# Patient Record
Sex: Female | Born: 2000 | Race: White | Hispanic: No | Marital: Single | Smoking: Former smoker
Health system: Southern US, Community
[De-identification: ages and names within clinical notes are randomized; demographics above are authoritative.]

## PROBLEM LIST (undated history)

## (undated) DIAGNOSIS — R110 Nausea: Secondary | ICD-10-CM

## (undated) DIAGNOSIS — F419 Anxiety disorder, unspecified: Secondary | ICD-10-CM

## (undated) DIAGNOSIS — F429 Obsessive-compulsive disorder, unspecified: Secondary | ICD-10-CM

## (undated) DIAGNOSIS — R109 Unspecified abdominal pain: Secondary | ICD-10-CM

## (undated) DIAGNOSIS — U071 COVID-19: Secondary | ICD-10-CM

## (undated) HISTORY — PX: TONSILLECTOMY: SUR1361

## (undated) HISTORY — DX: Unspecified abdominal pain: R10.9

## (undated) HISTORY — DX: Obsessive-compulsive disorder, unspecified: F42.9

## (undated) HISTORY — DX: Anxiety disorder, unspecified: F41.9

## (undated) HISTORY — DX: Nausea: R11.0

## (undated) HISTORY — PX: KNEE SURGERY: SHX244

## (undated) HISTORY — PX: WISDOM TOOTH EXTRACTION: SHX21

---
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
## 2020-04-09 ENCOUNTER — Ambulatory Visit: Payer: BC Managed Care – PPO

## 2020-04-26 ENCOUNTER — Other Ambulatory Visit: Payer: Self-pay | Admitting: Internal Medicine

## 2020-04-26 DIAGNOSIS — F419 Anxiety disorder, unspecified: Secondary | ICD-10-CM

## 2020-05-12 ENCOUNTER — Other Ambulatory Visit: Payer: Self-pay

## 2020-05-12 ENCOUNTER — Emergency Department
Admission: EM | Admit: 2020-05-12 | Discharge: 2020-05-12 | Disposition: A | Discharge status: Home / Self Care | Payer: 59 | Attending: Emergency Medicine | Admitting: Emergency Medicine

## 2020-05-12 ENCOUNTER — Encounter: Payer: Self-pay | Admitting: Medical Oncology

## 2020-05-12 ENCOUNTER — Emergency Department: Payer: 59

## 2020-05-12 DIAGNOSIS — Y9301 Activity, walking, marching and hiking: Secondary | ICD-10-CM | POA: Diagnosis not present

## 2020-05-12 DIAGNOSIS — Z91010 Allergy to peanuts: Secondary | ICD-10-CM | POA: Diagnosis not present

## 2020-05-12 DIAGNOSIS — S83004A Unspecified dislocation of right patella, initial encounter: Secondary | ICD-10-CM | POA: Diagnosis not present

## 2020-05-12 DIAGNOSIS — Z87891 Personal history of nicotine dependence: Secondary | ICD-10-CM | POA: Diagnosis not present

## 2020-05-12 DIAGNOSIS — S8991XA Unspecified injury of right lower leg, initial encounter: Secondary | ICD-10-CM | POA: Diagnosis present

## 2020-05-12 DIAGNOSIS — X501XXA Overexertion from prolonged static or awkward postures, initial encounter: Secondary | ICD-10-CM | POA: Diagnosis not present

## 2020-05-12 MED ORDER — KETOROLAC TROMETHAMINE 30 MG/ML IJ SOLN
15.0000 mg | Freq: Once | INTRAMUSCULAR | Status: AC
  Administered 2020-05-12: 15 mg via INTRAVENOUS
  Filled 2020-05-12: qty 1

## 2020-05-12 NOTE — ED Triage Notes (Signed)
Pt reports that she was walking to her bed when her knee cap dislocated. Pt reports that she has had this happen before about 2 years ago, Pt was given of fentanyl pta.

## 2020-05-12 NOTE — ED Notes (Signed)
PT verbally signed discharge paper work

## 2020-05-12 NOTE — ED Provider Notes (Signed)
.  She denies  [facility] Emergency Department Provider Note ____________________________________________   Event Date/Time   First MD Initiated Contact with Patient 05/12/20 1412     (approximate)  I have reviewed the triage vital signs and the nursing notes.  HISTORY  Chief Complaint Knee Pain   HPI Stacey Foley is a 20 y.o. femalewho presents to the ED for evaluation of knee pain.   Chart review indicates no relevant medical history. Patient self-reports a history of patellar dislocation about 4 years ago.  Patient reports no recent illnesses.  She was ambulating this morning when she did a twisting motion and had immediate pain and dislocation to her right patella any falls, trauma, syncope, head injury or pain beyond her right knee.  Presents to the ED via EMS and 10/10 right knee pain.  I empirically reduce patellar dislocation on arrival.  Resolution of pain thereafter.  Past Medical History:  Diagnosis Date  . Abdominal pain   . Anxiety   . Anxiety   . Nausea     Patient Active Problem List   Diagnosis Date Noted  . Irritable bowel syndrome with both constipation and diarrhea 09/11/2019  . Anxiety and depression 09/11/2019  . Insomnia 05/03/2019  . Generalized anxiety disorder 12/03/2017  . Abdominal pain   . Nausea     Past Surgical History:  Procedure Laterality Date  . TONSILLECTOMY    . WISDOM TOOTH EXTRACTION      Prior to Admission medications   Medication Sig Start Date End Date Taking? Authorizing Provider  citalopram (CELEXA) 10 MG tablet TAKE 1 TABLET BY MOUTH EVERY DAY 04/28/20   McLean-Scocuzza, Pasty Spillers, MD  Clotrimazole 1 % OINT Apply 1 application topically 2 (two) times daily as needed. 10/05/19   McLean-Scocuzza, Pasty Spillers, MD  drospirenone-ethinyl estradiol (YASMIN) 3-0.03 MG tablet See admin instructions. 09/02/18   [provider]  hydrOXYzine (ATARAX/VISTARIL) 25 MG tablet TAKE 0.5-1 TABLETS (12.5-25 MG  TOTAL) BY MOUTH DAILY AS NEEDED FOR ANXIETY. 05/25/19   McLean-Scocuzza, Pasty Spillers, MD  mupirocin ointment (BACTROBAN) 2 % Apply 1 application topically 3 (three) times daily as needed. 10/05/19   McLean-Scocuzza, Pasty Spillers, MD    Allergies Peanut-containing drug products  Family History  Problem Relation Age of Onset  . Hypertension Father     Social History Social History   Tobacco Use  . Smoking status: Former Smoker    Types: E-cigarettes  . Smokeless tobacco: Never Used  . Tobacco comment: cigs then E cigs   Vaping Use  . Vaping Use: Every day  Substance Use Topics  . Alcohol use: Never  . Drug use: Never    Review of Systems  Constitutional: No fever/chills Eyes: No visual changes. ENT: No sore throat. Cardiovascular: Denies chest pain. Respiratory: Denies shortness of breath. Gastrointestinal: No abdominal pain.  No nausea, no vomiting.  No diarrhea.  No constipation. Genitourinary: Negative for dysuria. Musculoskeletal: Negative for back pain.  Positive for right knee pain after twisting injury. Skin: Negative for rash. Neurological: Negative for headaches, focal weakness or numbness.  ____________________________________________   PHYSICAL EXAM:  VITAL SIGNS: Vitals:   05/12/20 1420  BP: (!) 108/57  Pulse: (!) 104  Resp: 18  Temp: 98.7 F (37.1 C)  SpO2: 100%     Constitutional: Alert and oriented. Well appearing and in no acute distress. Eyes: Conjunctivae are normal. PERRL. EOMI. Head: Atraumatic. Nose: No congestion/rhinnorhea. Mouth/Throat: Mucous membranes are moist.  Oropharynx non-erythematous. Neck: No  stridor. No cervical spine tenderness to palpation. Cardiovascular: Normal rate, regular rhythm. Grossly normal heart sounds.  Good peripheral circulation. Respiratory: Normal respiratory effort.  No retractions. Lungs CTAB. Gastrointestinal: Soft , nondistended, nontender to palpation. No CVA tenderness. Musculoskeletal: On presentation prior  to empiric reduction, evidence of closed right-sided patellar dislocation.  Patella is laterally dislocated without overlying laceration or signs of open injury. Right leg is distally neurovascularly intact. Palpation of all 4 extremities otherwise no evidence of deformity trauma or tenderness. Neurologic:  Normal speech and language. No gross focal neurologic deficits are appreciated. No gait instability noted. Skin:  Skin is warm, dry and intact. No rash noted. Psychiatric: Mood and affect are normal. Speech and behavior are normal.  ____________________________________________   LABS (all labs ordered are listed, but only abnormal results are displayed)  Labs Reviewed - No data to display  ____________________________________________  RADIOLOGY  ED MD interpretation: Plain film of the right knee reviewed by me without evidence of fracture or dislocation.  Official radiology report(s): DG Knee Right Port  Result Date: 05/12/2020 CLINICAL DATA:  Reduction of lateral patellar dislocation EXAM: PORTABLE RIGHT KNEE - 1-2 VIEW COMPARISON:  07/23/2017 FINDINGS: Frontal and lateral views of the right knee demonstrate no fracture, subluxation, or dislocation. Joint spaces are well preserved. No joint effusion. Soft tissues are unremarkable. IMPRESSION: 1. Unremarkable right knee. Electronically Signed   By: Sharlet Salina M.D.   On: 05/12/2020 15:01    ____________________________________________   PROCEDURES and INTERVENTIONS  Procedure(s) performed (including Critical Care):  Reduction of dislocation  Date/Time: 05/12/2020 3:37 PM Performed by: Delton Prairie, MD Authorized by: Delton Prairie, MD  Local anesthesia used: no  Anesthesia: Local anesthesia used: no  Sedation: Patient sedated: no  Patient tolerance: patient tolerated the procedure well with no immediate complications Comments: Concurrent extension of the right knee and medial pressure on the patella yields empiric  reduction and return to anatomic alignment.     Medications  ketorolac (TORADOL) 30 MG/ML injection 15 mg (15 mg Intravenous Given 05/12/20 1427)    ____________________________________________   MDM / ED COURSE   20 year old woman presents to the ED with accidental traumatic patellar dislocation, amenable to empiric reduction and outpatient management.  Hemodynamically stable.  Presents with obvious lateral right-sided patellar dislocation that I reduce empirically on arrival with resolution of her pain.  Follow-up x-rays without evidence of any bony injury or further dislocation.  She provides her own knee immobilizer, which will have the nurses assist her with placing.  We discussed following up with orthopedic surgery and return precautions for the ED.  Patient medically stable for outpatient management.  Clinical Course as of 05/12/20 1538  Mon May 12, 2020  1413 Empirically reduced by me. [DS]  1530 Reassessed.  Mother now the bedside.  Patient looks well and denies any pain.  Reports for much better.  They actually brought their own knee immobilizer from home.  They report that she had a similar patellar dislocation about 4 years ago.  We discussed the immobilization and follow-up with orthopedic surgery.  We discussed return precautions for the ED. [DS]    Clinical Course User Index [DS] Delton Prairie, MD    ____________________________________________   FINAL CLINICAL IMPRESSION(S) / ED DIAGNOSES  Final diagnoses:  Dislocation of right patella, initial encounter     ED Discharge Orders    None       Dylan Katrinka Blazing   Note:  This document was prepared using Dragon voice recognition software and  may include unintentional dictation errors.   Delton Prairie, MD 05/12/20 1539

## 2020-05-12 NOTE — Discharge Instructions (Addendum)
Please take Tylenol and ibuprofen/Advil for your pain.  It is safe to take them together, or to alternate them every few hours.  Take up to 1000mg  of Tylenol at a time, up to 4 times per day.  Do not take more than 4000 mg of Tylenol in 24 hours.  For ibuprofen, take 400-600 mg, 4-5 times per day.  Please keep your knee in the knee immobilizer until he can follow-up with the orthopedic surgeons.  Return to the ED with any recurrent dislocations.

## 2020-05-12 NOTE — ED Notes (Signed)
Patient transported to X-ray 

## 2020-05-19 ENCOUNTER — Other Ambulatory Visit: Payer: Self-pay | Admitting: Family Medicine

## 2020-05-19 ENCOUNTER — Other Ambulatory Visit: Payer: Self-pay | Admitting: Student

## 2020-05-19 DIAGNOSIS — S83014A Lateral dislocation of right patella, initial encounter: Secondary | ICD-10-CM

## 2020-05-19 DIAGNOSIS — M2201 Recurrent dislocation of patella, right knee: Secondary | ICD-10-CM

## 2020-05-30 ENCOUNTER — Other Ambulatory Visit: Payer: Self-pay

## 2020-05-30 ENCOUNTER — Ambulatory Visit
Admission: RE | Admit: 2020-05-30 | Discharge: 2020-05-30 | Disposition: A | Discharge status: Home / Self Care | Payer: 59 | Source: Ambulatory Visit | Attending: Student | Admitting: Student

## 2020-05-30 DIAGNOSIS — M2201 Recurrent dislocation of patella, right knee: Secondary | ICD-10-CM | POA: Diagnosis present

## 2020-05-30 DIAGNOSIS — S83014A Lateral dislocation of right patella, initial encounter: Secondary | ICD-10-CM

## 2020-06-05 ENCOUNTER — Encounter: Payer: Self-pay | Admitting: Internal Medicine

## 2020-06-20 DIAGNOSIS — M25561 Pain in right knee: Secondary | ICD-10-CM | POA: Insufficient documentation

## 2020-07-08 ENCOUNTER — Encounter: Payer: Self-pay | Admitting: Internal Medicine

## 2020-07-08 DIAGNOSIS — F419 Anxiety disorder, unspecified: Secondary | ICD-10-CM

## 2020-07-08 MED ORDER — HYDROXYZINE HCL 25 MG PO TABS: 12.5000 mg | ORAL_TABLET | Freq: Every day | ORAL | 0 refills | Status: DC | PRN

## 2020-07-29 ENCOUNTER — Encounter: Payer: Self-pay | Admitting: Internal Medicine

## 2020-07-30 NOTE — Telephone Encounter (Signed)
Please advise 

## 2020-10-04 ENCOUNTER — Other Ambulatory Visit: Payer: Self-pay | Admitting: Internal Medicine

## 2020-10-04 DIAGNOSIS — F419 Anxiety disorder, unspecified: Secondary | ICD-10-CM

## 2020-10-12 ENCOUNTER — Other Ambulatory Visit: Payer: Self-pay | Admitting: Internal Medicine

## 2020-10-12 DIAGNOSIS — F419 Anxiety disorder, unspecified: Secondary | ICD-10-CM

## 2020-10-12 MED ORDER — HYDROXYZINE HCL 25 MG PO TABS: 12.5000 mg | ORAL_TABLET | Freq: Every day | ORAL | 3 refills | Status: DC | PRN

## 2020-10-20 ENCOUNTER — Ambulatory Visit
Admission: EM | Admit: 2020-10-20 | Discharge: 2020-10-20 | Disposition: A | Discharge status: Home / Self Care | Payer: 59 | Attending: Emergency Medicine | Admitting: Emergency Medicine

## 2020-10-20 ENCOUNTER — Other Ambulatory Visit: Payer: Self-pay

## 2020-10-20 DIAGNOSIS — B349 Viral infection, unspecified: Secondary | ICD-10-CM

## 2020-10-20 NOTE — Discharge Instructions (Addendum)
Your COVID test is pending.  You should self quarantine until the test result is back.    Take Tylenol or ibuprofen as needed for fever or discomfort.  Rest and keep yourself hydrated.    Follow-up with your primary care provider if your symptoms are not improving.     

## 2020-10-20 NOTE — ED Provider Notes (Signed)
[name]    CSN: [id number] Arrival date & time: 10/20/20  2563      History   Chief Complaint Chief Complaint  Patient presents with   Nasal Congestion    HPI Stacey Foley is a 20 y.o. female.  Patient presents with 4-day history of nasal congestion.  She had a sore throat and diarrhea but the symptoms have resolved.  Treatment at home with Tylenol, ibuprofen, Mucinex.  She denies fever, rash, cough, shortness of breath, vomiting, or other symptoms.  Her medical history includes IBS, anxiety, insomnia.  The history is provided by the patient and medical records.   Past Medical History:  Diagnosis Date   Abdominal pain    Anxiety    Anxiety    Nausea     Patient Active Problem List   Diagnosis Date Noted   Irritable bowel syndrome with both constipation and diarrhea 09/11/2019   Anxiety and depression 09/11/2019   Insomnia 05/03/2019   Generalized anxiety disorder 12/03/2017   Abdominal pain    Nausea     Past Surgical History:  Procedure Laterality Date   TONSILLECTOMY     WISDOM TOOTH EXTRACTION      OB History   No obstetric history on file.      Home Medications    Prior to Admission medications   Medication Sig Start Date End Date Taking? Authorizing Provider  citalopram (CELEXA) 10 MG tablet TAKE 1 TABLET BY MOUTH EVERY DAY 04/28/20   McLean-Scocuzza, Pasty Spillers, MD  Clotrimazole 1 % OINT Apply 1 application topically 2 (two) times daily as needed. 10/05/19   McLean-Scocuzza, Pasty Spillers, MD  drospirenone-ethinyl estradiol (YASMIN) 3-0.03 MG tablet See admin instructions. 09/02/18   [provider]  hydrOXYzine (ATARAX/VISTARIL) 25 MG tablet Take 0.5-1 tablets (12.5-25 mg total) by mouth daily as needed for anxiety. 10/12/20   McLean-Scocuzza, Pasty Spillers, MD  mupirocin ointment (BACTROBAN) 2 % Apply 1 application topically 3 (three) times daily as needed. 10/05/19   McLean-Scocuzza, Pasty Spillers, MD    Family History Family History  Problem  Relation Age of Onset   Hypertension Father     Social History Social History   Tobacco Use   Smoking status: Former    Types: E-cigarettes   Smokeless tobacco: Never   Tobacco comments:    cigs then E cigs   Vaping Use   Vaping Use: Every day  Substance Use Topics   Alcohol use: Never   Drug use: Never     Allergies   Peanut-containing drug products   Review of Systems Review of Systems  Constitutional:  Negative for chills and fever.  HENT:  Positive for congestion and sore throat. Negative for ear pain.   Respiratory:  Negative for cough and shortness of breath.   Cardiovascular:  Negative for chest pain and palpitations.  Gastrointestinal:  Positive for diarrhea. Negative for abdominal pain, nausea and vomiting.  Skin:  Negative for color change and rash.  All other systems reviewed and are negative.   Physical Exam Triage Vital Signs ED Triage Vitals  Enc Vitals Group     BP 10/20/20 1046 122/82     Pulse Rate 10/20/20 1046 96     Resp 10/20/20 1046 18     Temp 10/20/20 1046 99.1 F (37.3 C)     Temp Source 10/20/20 1046 Oral     SpO2 10/20/20 1046 97 %     Weight --      Height --  Head Circumference --      Peak Flow --      Pain Score 10/20/20 1049 0     Pain Loc --      Pain Edu? --      Excl. in GC? --    No data found.  Updated Vital Signs BP 122/82 (BP Location: Left Arm)   Pulse 96   Temp 99.1 F (37.3 C) (Oral)   Resp 18   LMP 09/29/2020 (Within Weeks)   SpO2 97%   Visual Acuity Right Eye Distance:   Left Eye Distance:   Bilateral Distance:    Right Eye Near:   Left Eye Near:    Bilateral Near:     Physical Exam Vitals and nursing note reviewed.  Constitutional:      General: She is not in acute distress.    Appearance: She is well-developed. She is not ill-appearing.  HENT:     Head: Normocephalic and atraumatic.     Right Ear: Tympanic membrane normal.     Left Ear: Tympanic membrane normal.     Nose: Nose  normal.     Mouth/Throat:     Mouth: Mucous membranes are moist.     Pharynx: Oropharynx is clear.  Eyes:     Conjunctiva/sclera: Conjunctivae normal.  Cardiovascular:     Rate and Rhythm: Normal rate and regular rhythm.     Heart sounds: Normal heart sounds.  Pulmonary:     Effort: Pulmonary effort is normal. No respiratory distress.     Breath sounds: Normal breath sounds.  Abdominal:     General: Bowel sounds are normal.     Palpations: Abdomen is soft.     Tenderness: There is no abdominal tenderness. There is no guarding or rebound.  Musculoskeletal:     Cervical back: Neck supple.  Skin:    General: Skin is warm and dry.  Neurological:     General: No focal deficit present.     Mental Status: She is alert and oriented to person, place, and time.     Gait: Gait normal.  Psychiatric:        Mood and Affect: Mood normal.        Behavior: Behavior normal.     UC Treatments / Results  Labs (all labs ordered are listed, but only abnormal results are displayed) Labs Reviewed  NOVEL CORONAVIRUS, NAA    EKG   Radiology No results found.  Procedures Procedures (including critical care time)  Medications Ordered in UC Medications - No data to display  Initial Impression / Assessment and Plan / UC Course  I have reviewed the triage vital signs and the nursing notes.  Pertinent labs & imaging results that were available during my care of the patient were reviewed by me and considered in my medical decision making (see chart for details).   Viral illness.  COVID pending.  Instructed patient to self quarantine per CDC guidelines.  Discussed symptomatic treatment including Tylenol or ibuprofen, rest, hydration.  Instructed patient to follow up with PCP if symptoms are not improving.  Patient agrees to plan of care.    Final Clinical Impressions(s) / UC Diagnoses   Final diagnoses:  Viral illness     Discharge Instructions      Your COVID test is pending.  You  should self quarantine until the test result is back.    Take Tylenol or ibuprofen as needed for fever or discomfort.  Rest and keep yourself hydrated.  Follow-up with your primary care provider if your symptoms are not improving.         ED Prescriptions   None    PDMP not reviewed this encounter.   Mickie Bail, NP 10/20/20 [phone]

## 2020-10-20 NOTE — ED Triage Notes (Signed)
Patient presents to Urgent Care with complaints of congestion x 4 days. Has a hx of seasonal allergies. Treating symptoms with tylenol, ibuprofen, and mucinex.   Denies fever.

## 2020-10-21 LAB — SARS-COV-2, NAA 2 DAY TAT

## 2020-10-21 LAB — NOVEL CORONAVIRUS, NAA: SARS-CoV-2, NAA: NOT DETECTED

## 2020-10-22 ENCOUNTER — Encounter: Payer: Self-pay | Admitting: Internal Medicine

## 2020-10-23 ENCOUNTER — Telehealth: Payer: Self-pay | Admitting: Internal Medicine

## 2020-10-23 ENCOUNTER — Encounter: Payer: Self-pay | Admitting: Internal Medicine

## 2020-10-23 ENCOUNTER — Other Ambulatory Visit: Payer: Self-pay

## 2020-10-23 ENCOUNTER — Emergency Department
Admission: EM | Admit: 2020-10-23 | Discharge: 2020-10-23 | Disposition: A | Discharge status: Home / Self Care | Payer: 59 | Attending: Emergency Medicine | Admitting: Emergency Medicine

## 2020-10-23 DIAGNOSIS — H538 Other visual disturbances: Secondary | ICD-10-CM

## 2020-10-23 DIAGNOSIS — Z91010 Allergy to peanuts: Secondary | ICD-10-CM | POA: Insufficient documentation

## 2020-10-23 DIAGNOSIS — H539 Unspecified visual disturbance: Secondary | ICD-10-CM

## 2020-10-23 DIAGNOSIS — Z87891 Personal history of nicotine dependence: Secondary | ICD-10-CM | POA: Diagnosis not present

## 2020-10-23 HISTORY — DX: COVID-19: U07.1

## 2020-10-23 NOTE — ED Triage Notes (Signed)
Pt comes into the ED via POV c/o blurred vision since being diagnosed with COVID on Tuesday.  Pt states she did clear the blurred vision some by using eye drops today.  Pt is neurologically intact at this time.  Pt states she is still seeing "floater" like objects.

## 2020-10-23 NOTE — ED Provider Notes (Signed)
[facility] EMERGENCY DEPARTMENT Provider Note   CSN: [id number] Arrival date & time: 10/23/20  1430     History Chief Complaint  Patient presents with   Covid Positive   Blurred Vision    Stacey Foley is a 20 y.o. female presents to the emergency department for evaluation of floaters in vision.  She was diagnosed with COVID 2 days ago, symptoms have been present for 3 days.  Her symptoms are feeling much better.  No chest pain, shortness of breath.  She has been taking some Mucinex.  She describes 2 days of blurred vision with seeing occasional floaters.  Vision is fine, she was able to drive over here.  She denies any visual field deficits nor headaches or fevers.      HPI     Past Medical History:  Diagnosis Date   Abdominal pain    Anxiety    Anxiety    COVID-19    10/22/20   Nausea     Patient Active Problem List   Diagnosis Date Noted   Irritable bowel syndrome with both constipation and diarrhea 09/11/2019   Anxiety and depression 09/11/2019   Insomnia 05/03/2019   Generalized anxiety disorder 12/03/2017   Abdominal pain    Nausea     Past Surgical History:  Procedure Laterality Date   TONSILLECTOMY     WISDOM TOOTH EXTRACTION       OB History   No obstetric history on file.     Family History  Problem Relation Age of Onset   Hypertension Father     Social History   Tobacco Use   Smoking status: Former    Types: E-cigarettes   Smokeless tobacco: Never   Tobacco comments:    cigs then E cigs   Vaping Use   Vaping Use: Every day  Substance Use Topics   Alcohol use: Never   Drug use: Never    Home Medications Prior to Admission medications   Medication Sig Start Date End Date Taking? Authorizing Provider  citalopram (CELEXA) 10 MG tablet TAKE 1 TABLET BY MOUTH EVERY DAY 04/28/20   McLean-Scocuzza, Pasty Spillers, MD  Clotrimazole 1 % OINT Apply 1 application topically 2 (two) times daily as needed. 10/05/19    McLean-Scocuzza, Pasty Spillers, MD  drospirenone-ethinyl estradiol (YASMIN) 3-0.03 MG tablet See admin instructions. 09/02/18   [provider]  hydrOXYzine (ATARAX/VISTARIL) 25 MG tablet Take 0.5-1 tablets (12.5-25 mg total) by mouth daily as needed for anxiety. 10/12/20   McLean-Scocuzza, Pasty Spillers, MD  mupirocin ointment (BACTROBAN) 2 % Apply 1 application topically 3 (three) times daily as needed. 10/05/19   McLean-Scocuzza, Pasty Spillers, MD    Allergies    Peanut-containing drug products  Review of Systems   Review of Systems  Constitutional:  Negative for fever.  Eyes:  Positive for visual disturbance. Negative for photophobia, discharge and redness.  Respiratory:  Negative for cough.   Skin:  Negative for rash.   Physical Exam Updated Vital Signs BP 138/80 (BP Location: Left Arm)   Pulse 88   Temp 99.1 F (37.3 C) (Oral)   Resp 17   Ht 5\' 2"  (1.575 m)   LMP 09/29/2020 (Within Weeks)   SpO2 97%   BMI 20.12 kg/m   Physical Exam Constitutional:      Appearance: She is well-developed.  HENT:     Head: Normocephalic and atraumatic.     Nose: Nose normal.  Eyes:     General:  Right eye: No discharge.        Left eye: No discharge.     Extraocular Movements: Extraocular movements intact.     Conjunctiva/sclera: Conjunctivae normal.     Pupils: Pupils are equal, round, and reactive to light.     Comments: No visual field deficits.  Conjunctival normal.  No drainage.  No swelling.  Cardiovascular:     Rate and Rhythm: Normal rate.  Pulmonary:     Effort: Pulmonary effort is normal. No respiratory distress.  Musculoskeletal:        General: Normal range of motion.     Cervical back: Normal range of motion.  Skin:    General: Skin is warm.     Findings: No rash.  Neurological:     Mental Status: She is alert and oriented to person, place, and time.  Psychiatric:        Behavior: Behavior normal.        Thought Content: Thought content normal.    ED Results /  Procedures / Treatments   Labs (all labs ordered are listed, but only abnormal results are displayed) Labs Reviewed - No data to display  EKG None  Radiology No results found.  Procedures Procedures   Medications Ordered in ED Medications - No data to display  ED Course  I have reviewed the triage vital signs and the nursing notes.  Pertinent labs & imaging results that were available during my care of the patient were reviewed by me and considered in my medical decision making (see chart for details).    MDM Rules/Calculators/A&P                           20 year old female with visual changes, she describes slightly blurred vision with intermittent floaters in both eyes.  No headache, visual field defects.  Eyes appear well on exam.  No signs of any infectious process.  Normal visual acuity.  Discussed with ophthalmologist who recommended following up in a week.  He recommends calling sooner for any vision loss.  Patient understands signs symptoms return to the ER for. Final Clinical Impression(s) / ED Diagnoses Final diagnoses:  Vision changes  Blurred vision, bilateral    Rx / DC Orders ED Discharge Orders     None        Ronnette Juniper 10/23/20 1803    Shaune Pollack, MD 10/28/20 [phone]

## 2020-10-23 NOTE — Telephone Encounter (Signed)
Patient is calling about her MyChart message,please call her to discuss this.

## 2020-10-23 NOTE — ED Notes (Signed)
ED Provider at bedside. 

## 2020-10-23 NOTE — Telephone Encounter (Signed)
Please advise 

## 2020-10-23 NOTE — Telephone Encounter (Signed)
Rec pt go to [facility] ED or [facility] ED asap for evaluation

## 2020-10-23 NOTE — Telephone Encounter (Signed)
Left message to return call 

## 2020-10-23 NOTE — ED Notes (Signed)
Patient reports COVID+ on Tuesday. Patient reports blurred vision x 2 days. Patient reports "splotchy" vision as well. Patient reports some floaters as well. Patient reports she wears corrective lenses at baseline, and does have an optometrist.

## 2020-10-23 NOTE — Discharge Instructions (Addendum)
Please call ophthalmology office tomorrow to schedule an appointment for Thursday next week.  Call sooner for any worsening vision changes.

## 2020-10-23 NOTE — Telephone Encounter (Signed)
Patient informed and verbalized understanding that Dr French Ana McLean-Scocuzza recceomends going to the ED for symptoms mentioned in mychart message.   Patient states she will go to be evaluated

## 2020-10-24 NOTE — Telephone Encounter (Signed)
See 10/23/20 telephone encounter

## 2020-10-31 ENCOUNTER — Encounter: Payer: Self-pay | Admitting: Internal Medicine

## 2020-11-04 NOTE — Telephone Encounter (Signed)
Stacey Foley see me about this and please  - notify pt that monkeypox spreads from direct contact with an infectious rash or bodily fluids from respiratory secretions during close physical contact. That includes things like kissing, cuddling, sex or touching items like clothing or linens that have touched the rash or infectious fluids. We're mostly seeing monkeypox in men who have sex with men or people in close contact with those who have been diagnosed with monkeypox. However, it's not considered a sexually transmitted disease, but instead it's just often spread through close, sustained physical contact, which obviously can include sexual contact. The overall risk to community is low.  To date has been most often seen with men who are having sex with men.  . While the overall risk to the community is low, if you are part of a population that is at risk, then getting vaccinated would be recommended .

## 2020-11-05 NOTE — Telephone Encounter (Signed)
Patient notified and voiced understanding.

## 2020-11-14 ENCOUNTER — Telehealth: Payer: Self-pay

## 2020-11-14 NOTE — Telephone Encounter (Signed)
Confirmed faxed [employer] [employer] Prescriber response form. Form has been sent to scan 

## 2020-12-02 ENCOUNTER — Ambulatory Visit
Admission: EM | Admit: 2020-12-02 | Discharge: 2020-12-02 | Disposition: A | Discharge status: Home / Self Care | Payer: 59 | Attending: Emergency Medicine | Admitting: Emergency Medicine

## 2020-12-02 ENCOUNTER — Other Ambulatory Visit: Payer: Self-pay

## 2020-12-02 ENCOUNTER — Encounter: Payer: Self-pay | Admitting: Emergency Medicine

## 2020-12-02 DIAGNOSIS — Z20822 Contact with and (suspected) exposure to covid-19: Secondary | ICD-10-CM | POA: Diagnosis not present

## 2020-12-02 DIAGNOSIS — J069 Acute upper respiratory infection, unspecified: Secondary | ICD-10-CM

## 2020-12-02 DIAGNOSIS — J302 Other seasonal allergic rhinitis: Secondary | ICD-10-CM

## 2020-12-02 LAB — POCT RAPID STREP A (OFFICE): Rapid Strep A Screen: NEGATIVE

## 2020-12-02 NOTE — ED Provider Notes (Signed)
Stacey Foley    CSN: [id number] Arrival date & time: 12/02/20  1001      History   Chief Complaint Chief Complaint  Patient presents with   Sore Throat    HPI Stacey Foley is a 20 y.o. female.  Patient presents with sore throat and postnasal drip x3 days.  She had a headache at the onset of her symptoms but this has resolved.  She denies fever, chills, rash, cough, shortness of breath, or other symptoms.  No treatment attempted at home today; she took OTC sinus medication yesterday.  Her history includes COVID last month.  She also reports seasonal allergies.   The history is provided by the patient and medical records.   Past Medical History:  Diagnosis Date   Abdominal pain    Anxiety    Anxiety    COVID-19    10/22/20   Nausea     Patient Active Problem List   Diagnosis Date Noted   Irritable bowel syndrome with both constipation and diarrhea 09/11/2019   Anxiety and depression 09/11/2019   Insomnia 05/03/2019   Generalized anxiety disorder 12/03/2017   Abdominal pain    Nausea     Past Surgical History:  Procedure Laterality Date   KNEE SURGERY Right    TONSILLECTOMY     WISDOM TOOTH EXTRACTION      OB History   No obstetric history on file.      Home Medications    Prior to Admission medications   Medication Sig Start Date End Date Taking? Authorizing Provider  citalopram (CELEXA) 10 MG tablet TAKE 1 TABLET BY MOUTH EVERY DAY 04/28/20  Yes McLean-Scocuzza, Pasty Spillers, MD  hydrOXYzine (ATARAX/VISTARIL) 25 MG tablet Take 0.5-1 tablets (12.5-25 mg total) by mouth daily as needed for anxiety. 10/12/20  Yes McLean-Scocuzza, Pasty Spillers, MD  Clotrimazole 1 % OINT Apply 1 application topically 2 (two) times daily as needed. 10/05/19   McLean-Scocuzza, Pasty Spillers, MD  drospirenone-ethinyl estradiol (YASMIN) 3-0.03 MG tablet See admin instructions. 09/02/18   [provider]  mupirocin ointment (BACTROBAN) 2 % Apply 1 application topically 3 (three)  times daily as needed. 10/05/19   McLean-Scocuzza, Pasty Spillers, MD    Family History Family History  Problem Relation Age of Onset   Hypertension Father     Social History Social History   Tobacco Use   Smoking status: Former    Types: E-cigarettes   Smokeless tobacco: Never   Tobacco comments:    cigs then E cigs   Vaping Use   Vaping Use: Every day  Substance Use Topics   Alcohol use: Never   Drug use: Never     Allergies   Peanut-containing drug products   Review of Systems Review of Systems  Constitutional:  Negative for chills and fever.  HENT:  Positive for postnasal drip and sore throat. Negative for ear pain.   Respiratory:  Negative for cough and shortness of breath.   Cardiovascular:  Negative for chest pain and palpitations.  Gastrointestinal:  Negative for abdominal pain, diarrhea and vomiting.  Skin:  Negative for color change and rash.  All other systems reviewed and are negative.   Physical Exam Triage Vital Signs ED Triage Vitals  Enc Vitals Group     BP 12/02/20 1037 112/76     Pulse Rate 12/02/20 1037 96     Resp --      Temp 12/02/20 1037 99.2 F (37.3 C)     Temp Source 12/02/20  1037 Oral     SpO2 12/02/20 1037 97 %     Weight --      Height --      Head Circumference --      Peak Flow --      Pain Score 12/02/20 1035 3     Pain Loc --      Pain Edu? --      Excl. in GC? --    No data found.  Updated Vital Signs BP 112/76 (BP Location: Left Arm)   Pulse 96   Temp 99.2 F (37.3 C) (Oral)   LMP 11/25/2020 (Exact Date)   SpO2 97%   Visual Acuity Right Eye Distance:   Left Eye Distance:   Bilateral Distance:    Right Eye Near:   Left Eye Near:    Bilateral Near:     Physical Exam Vitals and nursing note reviewed.  Constitutional:      General: She is not in acute distress.    Appearance: She is well-developed. She is not ill-appearing.  HENT:     Head: Normocephalic and atraumatic.     Right Ear: Tympanic membrane  normal.     Left Ear: Tympanic membrane normal.     Nose: Nose normal.     Mouth/Throat:     Mouth: Mucous membranes are moist.     Pharynx: Posterior oropharyngeal erythema present.  Eyes:     Conjunctiva/sclera: Conjunctivae normal.  Cardiovascular:     Rate and Rhythm: Normal rate and regular rhythm.     Heart sounds: Normal heart sounds.  Pulmonary:     Effort: Pulmonary effort is normal. No respiratory distress.     Breath sounds: Normal breath sounds.  Abdominal:     Palpations: Abdomen is soft.     Tenderness: There is no abdominal tenderness.  Musculoskeletal:     Cervical back: Neck supple.  Skin:    General: Skin is warm and dry.  Neurological:     General: No focal deficit present.     Mental Status: She is alert and oriented to person, place, and time.     Gait: Gait normal.  Psychiatric:        Mood and Affect: Mood normal.        Behavior: Behavior normal.     UC Treatments / Results  Labs (all labs ordered are listed, but only abnormal results are displayed) Labs Reviewed  NOVEL CORONAVIRUS, NAA  POCT RAPID STREP A (OFFICE)    EKG   Radiology No results found.  Procedures Procedures (including critical care time)  Medications Ordered in UC Medications - No data to display  Initial Impression / Assessment and Plan / UC Course  I have reviewed the triage vital signs and the nursing notes.  Pertinent labs & imaging results that were available during my care of the patient were reviewed by me and considered in my medical decision making (see chart for details).   Viral URI, seasonal allergies.  Rapid strep negative.  No COVID test indicated today as patient was COVID positive last month.  Discussed symptomatic treatment including Tylenol or ibuprofen, Mucinex, rest, hydration.  Instructed patient to follow-up with her PCP or return here if her symptoms or not improving.  She agrees to plan of care.   Final Clinical Impressions(s) / UC Diagnoses    Final diagnoses:  Viral URI  Seasonal allergies     Discharge Instructions      Take Tylenol or ibuprofen as needed  for fever or discomfort.  Take Mucinex as needed for congestion.    Follow up with your primary care provider if your symptoms are not improving.         ED Prescriptions   None    PDMP not reviewed this encounter.   Mickie Bail, NP 12/02/20 1119

## 2020-12-02 NOTE — ED Triage Notes (Signed)
Pt c/o of sore throat x 3 days. She has tried several OTC medication that has not helped with symptoms. She had a HA a few days ago no HA today.

## 2020-12-02 NOTE — Discharge Instructions (Addendum)
Take Tylenol or ibuprofen as needed for fever or discomfort.  Take Mucinex as needed for congestion.  Follow up with your primary care provider if your symptoms are not improving.    

## 2020-12-06 ENCOUNTER — Encounter: Payer: Self-pay | Admitting: Internal Medicine

## 2020-12-06 DIAGNOSIS — R140 Abdominal distension (gaseous): Secondary | ICD-10-CM

## 2020-12-09 ENCOUNTER — Other Ambulatory Visit: Payer: Self-pay

## 2020-12-09 ENCOUNTER — Ambulatory Visit
Admission: RE | Admit: 2020-12-09 | Discharge: 2020-12-09 | Disposition: A | Discharge status: Home / Self Care | Payer: 59 | Source: Ambulatory Visit | Attending: Internal Medicine | Admitting: Internal Medicine

## 2020-12-09 VITALS — BP 104/70 | HR 87 | Temp 98.2°F | Resp 16

## 2020-12-09 DIAGNOSIS — J069 Acute upper respiratory infection, unspecified: Secondary | ICD-10-CM

## 2020-12-09 NOTE — ED Triage Notes (Signed)
11/30/2020 was the onset of having symptoms.  Patient has dry cough and continued chest pain with coughing.  Intermittently has a thick, green phlegm, but dry cough most of the time.  Has a runny nose and stuffy nose.

## 2020-12-09 NOTE — Discharge Instructions (Signed)
You are doing all of the right things to help yourself recover from this viral illness.  Continue using Mucinex.  You can use Mucinex DM or you can use plain Mucinex.  You can also take Delsym cough syrup if you think you need it.  However, if you take Mucinex DM you cannot take Delsym cough syrup as they both contain the same medicine dextromethorphan.  Keep using saline nasal spray.  Viral infections can take up to 14 days to completely resolve.

## 2020-12-09 NOTE — ED Provider Notes (Signed)
Stacey Foley    CSN: [id number] Arrival date & time: 12/09/20  1148      History   Chief Complaint Chief Complaint  Patient presents with   Cough    HPI Stacey Foley is a 20 y.o. female.  Patient was seen in urgent care 12/02/2020 for the same problem.  Patient was diagnosed with viral URI.  Patient feels like she is getting better but symptoms are still not completely resolved.  Patient reports postnasal drainage.  Biggest complaint is cough.  Cough is nonproductive; earlier in course of illness patient had green sputum but this is resolved and sputum is now clear "like spit."  Yesterday patient had intermittently chest pain associated with cough.  Patient does not have any chest pain today.  Patient does not feel short of breath, denies wheezing.  Patient denies fever or chills.  Since she vapes, she wanted to get her lungs checked out since she is still sick.  Patient stopped taking birth control pills mid September.  Had a period at that time.  Had protected sexual intercourse 12/03/2020 with condom.  Patient's nipples are now sore.  She wonders if she could be pregnant.   Cough  Past Medical History:  Diagnosis Date   Abdominal pain    Anxiety    Anxiety    COVID-19    10/22/20   Nausea     Patient Active Problem List   Diagnosis Date Noted   Irritable bowel syndrome with both constipation and diarrhea 09/11/2019   Anxiety and depression 09/11/2019   Insomnia 05/03/2019   Generalized anxiety disorder 12/03/2017   Abdominal pain    Nausea     Past Surgical History:  Procedure Laterality Date   KNEE SURGERY Right    TONSILLECTOMY     WISDOM TOOTH EXTRACTION      OB History   No obstetric history on file.      Home Medications    Prior to Admission medications   Medication Sig Start Date End Date Taking? Authorizing Provider  citalopram (CELEXA) 10 MG tablet TAKE 1 TABLET BY MOUTH EVERY DAY 04/28/20  Yes McLean-Scocuzza, Pasty Spillers, MD   Clotrimazole 1 % OINT Apply 1 application topically 2 (two) times daily as needed. Patient not taking: Reported on 12/09/2020 10/05/19   McLean-Scocuzza, Pasty Spillers, MD  drospirenone-ethinyl estradiol (YASMIN) 3-0.03 MG tablet See admin instructions. Patient not taking: Reported on 12/09/2020 09/02/18   [provider]  hydrOXYzine (ATARAX/VISTARIL) 25 MG tablet Take 0.5-1 tablets (12.5-25 mg total) by mouth daily as needed for anxiety. Patient not taking: Reported on 12/09/2020 10/12/20   McLean-Scocuzza, Pasty Spillers, MD  mupirocin ointment (BACTROBAN) 2 % Apply 1 application topically 3 (three) times daily as needed. Patient not taking: Reported on 12/09/2020 10/05/19   McLean-Scocuzza, Pasty Spillers, MD    Family History Family History  Problem Relation Age of Onset   Healthy Mother    Hypertension Father     Social History Social History   Tobacco Use   Smoking status: Former    Types: E-cigarettes   Smokeless tobacco: Never   Tobacco comments:    cigs then E cigs   Vaping Use   Vaping Use: Every day  Substance Use Topics   Alcohol use: Never   Drug use: Never     Allergies   Peanut-containing drug products   Review of Systems Review of Systems  Respiratory:  Positive for cough.     Physical Exam Triage Vital Signs  ED Triage Vitals  Enc Vitals Group     BP 12/09/20 1205 104/70     Pulse Rate 12/09/20 1205 87     Resp 12/09/20 1205 16     Temp 12/09/20 1205 98.2 F (36.8 C)     Temp Source 12/09/20 1205 Oral     SpO2 12/09/20 1205 97 %     Weight --      Height --      Head Circumference --      Peak Flow --      Pain Score 12/09/20 1209 0     Pain Loc --      Pain Edu? --      Excl. in GC? --    No data found.  Updated Vital Signs BP 104/70 (BP Location: Left Arm)   Pulse 87   Temp 98.2 F (36.8 C) (Oral)   Resp 16   LMP 11/25/2020 (Exact Date)   SpO2 97%   Visual Acuity Right Eye Distance:   Left Eye Distance:   Bilateral Distance:    Right  Eye Near:   Left Eye Near:    Bilateral Near:     Physical Exam Constitutional:      Appearance: Normal appearance. She is not ill-appearing.  HENT:     Right Ear: Tympanic membrane, ear canal and external ear normal.     Left Ear: Tympanic membrane, ear canal and external ear normal.     Nose: No congestion or rhinorrhea.     Mouth/Throat:     Mouth: Mucous membranes are moist.     Pharynx: Oropharynx is clear.  Cardiovascular:     Rate and Rhythm: Normal rate and regular rhythm.  Pulmonary:     Effort: Pulmonary effort is normal.     Breath sounds: Normal breath sounds.  Lymphadenopathy:     Cervical: No cervical adenopathy.  Neurological:     Mental Status: She is alert.     UC Treatments / Results  Labs (all labs ordered are listed, but only abnormal results are displayed) Labs Reviewed - No data to display  EKG   Radiology No results found.  Procedures Procedures (including critical care time)  Medications Ordered in UC Medications - No data to display  Initial Impression / Assessment and Plan / UC Course  I have reviewed the triage vital signs and the nursing notes.  Pertinent labs & imaging results that were available during my care of the patient were reviewed by me and considered in my medical decision making (see chart for details).  Reassured patient that viral infections can take up to 2 weeks to completely resolve.  Her symptoms are improving I do not think she has a secondary bacterial infection.  Advised patient to continue supportive care measures.  Advised patient its unlikely that she is pregnant but if she does not get another.  By mid-October, she can take a home pregnancy test.  Advised her to continue using condoms since she is stopped birth control pills.   Final Clinical Impressions(s) / UC Diagnoses   Final diagnoses:  Viral URI     Discharge Instructions      You are doing all of the right things to help yourself recover from  this viral illness.  Continue using Mucinex.  You can use Mucinex DM or you can use plain Mucinex.  You can also take Delsym cough syrup if you think you need it.  However, if you take Mucinex DM you cannot  take Delsym cough syrup as they both contain the same medicine dextromethorphan.  Keep using saline nasal spray.  Viral infections can take up to 14 days to completely resolve.   ED Prescriptions   None    PDMP not reviewed this encounter.   Cathlyn Parsons, NP 12/09/20 1301

## 2020-12-10 NOTE — Telephone Encounter (Signed)
Left a message to call back.

## 2020-12-10 NOTE — Telephone Encounter (Signed)
Please call pt.  I have placed the order for the referral to Dr Servando Snare.  Please call pt.  Need to confirm doing ok. If any acute  symptoms, will need to be evaluated.

## 2020-12-12 NOTE — Telephone Encounter (Signed)
Called to speak with Jinelle. Elaria verbalized understanding and had no further quesitons. Pt states she is having no current issues or complaints.

## 2020-12-16 ENCOUNTER — Telehealth: Payer: Self-pay | Admitting: Internal Medicine

## 2020-12-16 ENCOUNTER — Other Ambulatory Visit: Payer: Self-pay

## 2020-12-16 ENCOUNTER — Ambulatory Visit (HOSPITAL_COMMUNITY)
Admission: EM | Admit: 2020-12-16 | Discharge: 2020-12-16 | Disposition: A | Discharge status: Home / Self Care | Payer: 59

## 2020-12-16 DIAGNOSIS — F411 Generalized anxiety disorder: Secondary | ICD-10-CM | POA: Diagnosis not present

## 2020-12-16 NOTE — ED Provider Notes (Signed)
[facility]al [facility] Urgent Care Medical Screening Exam  Patient Name: Stacey Foley MRN: [id number] Date of Evaluation: 12/16/20 Chief Complaint:   Diagnosis:  Final diagnoses:  GAD (generalized anxiety disorder)    History of Present illness: Stacey Foley is a 20 y.o. female presents to [facility] urgent care accompanied by her mother.  Reported she just left her OB/GYN office because she discontinued her birth control pills.  States for the past week she has been experiencing mood irritability worsening anxiety and brain fog.  She denied suicidal or homicidal ideations.  Denies auditory or visual hallucinations.  Reports " my mood has been all over the place since that stopped taking my birth control pills."  States she has been on birth control pills for a while and wanted to discontinue because she felt they were not good for her body.  She reports a history of anxiety and depression and that makes.  States she has been followed by counseling service there for psychiatry for medication management.  She reports her OB/GYN made her a follow-up appointment  with [facility] -However she hasn't been contacted for a appointment. Reported she she is prescribed Celexa 10 mg daily.  Which she reports taking and tolerating well.  Discussed following up with partial hospitalization programming.  Patient and family to consider.  Patient to keep follow-up appointment with outpatient provider.  Support,encouragement and reassurance was provided.   During evaluation Stacey Foley is sitting in no acute distress. She is alert/oriented x 4; calm/cooperative; and mood congruent with affect.  She is speaking in a clear tone at moderate volume, and normal pace; with good eye contact.  Her thought process is coherent and relevant; There is no indication that she is currently responding to internal/external stimuli or experiencing delusional thought content; and she has denied  suicidal/self-harm/homicidal ideation, psychosis, and paranoia.   Patient has remained calm throughout assessment and has answered questions appropriately.     At this time Stacey Foley is educated and verbalizes understanding of mental health resources and other crisis services in the community. She is instructed to call 911 and present to the nearest emergency room should she experience any suicidal/homicidal ideation, auditory/visual/hallucinations, or detrimental worsening of her mental health condition. She was a also advised by Clinical research associate that she could call the toll-free phone on insurance card to assist with identifying in network counselors and agencies or number on back of Medicaid card to speak with care coordinator    Psychiatric Specialty Exam  Presentation  General Appearance:Appropriate for Environment  Eye Contact:Good  Speech:Clear and Coherent  Speech Volume:Normal  Handedness:Right   Mood and Affect  Mood:Anxious  Affect:Congruent   Thought Process  Thought Processes:Coherent  Descriptions of Associations:Intact  Orientation:Full (Time, Place and Person)  Thought Content:Logical    Hallucinations:None  Ideas of Reference:None  Suicidal Thoughts:No  Homicidal Thoughts:No   Sensorium  Memory:Immediate Good; Recent Good  Judgment:Good  Insight:Present   Executive Functions  Concentration:Good  Attention Span:Good  Recall:Good  Fund of Knowledge:Good  Language:Good   Psychomotor Activity  Psychomotor Activity:Normal   Assets  Assets:Housing   Sleep  Sleep: No data recorded Number of hours:  No data recorded  Nutritional Assessment (For OBS and [facility] admissions only) Has the patient had a weight loss or gain of 10 pounds or more in the last 3 months?: No Has the patient had a decrease in food intake/or appetite?: No Does the patient have dental problems?: No Does the patient  have eating habits or behaviors that may be  indicators of an eating disorder including binging or inducing vomiting?: No Has the patient recently lost weight without trying?: 0 Has the patient been eating poorly because of a decreased appetite?: 0 Malnutrition Screening Tool Score: 0   Physical Exam: Physical Exam Vitals and nursing note reviewed.  Eyes:     Pupils: Pupils are equal, round, and reactive to light.  Cardiovascular:     Rate and Rhythm: Normal rate.     Pulses: Normal pulses.  Neurological:     Mental Status: She is alert and oriented to person, place, and time.  Psychiatric:        Attention and Perception: Attention normal.        Mood and Affect: Mood is anxious and depressed.        Speech: Speech normal.        Behavior: Behavior normal.        Thought Content: Thought content normal.        Cognition and Memory: Cognition and memory normal.        Judgment: Judgment normal.   Review of Systems  Eyes: Negative.   Cardiovascular: Negative.   Musculoskeletal: Negative.   Skin: Negative.   Psychiatric/Behavioral:  Positive for depression. Negative for suicidal ideas. The patient is nervous/anxious and has insomnia.   All other systems reviewed and are negative. Blood pressure 122/78, pulse 81, temperature 98.2 F (36.8 C), temperature source Oral, resp. rate 16, last menstrual period 11/25/2020, SpO2 100 %. There is no height or weight on file to calculate BMI.  Musculoskeletal: Strength & Muscle Tone: within normal limits Gait & Station: normal Patient leans: N/A   [facility] MSE Discharge Disposition for Follow up and Recommendations: Based on my evaluation the patient does not appear to have an emergency medical condition and can be discharged with resources and follow up care in outpatient services for Partial Hospitalization Program   Oneta Rack, NP 12/16/2020, 4:23 PM

## 2020-12-16 NOTE — Discharge Instructions (Signed)
Take all medications as prescribed. Keep all follow-up appointments as scheduled.  Do not consume alcohol or use illegal drugs while on prescription medications. Report any adverse effects from your medications to your primary care provider promptly.  In the event of recurrent symptoms or worsening symptoms, call 911, a crisis hotline, or go to the nearest emergency department for evaluation.   

## 2020-12-16 NOTE — Telephone Encounter (Signed)
Patients mother called in stating that she is having adverse side effects from stopping her birth control was informed by her gynecologist to get labs with her pcp.Offered to schedule the patient for the next available next Tuesday with Worthy Rancher but the patients mother stated she has been written out of work for this week and it will not be in time enough for the patient.Please advise and call the patients mother.

## 2020-12-16 NOTE — BH Assessment (Signed)
Pt reports since coming off her birth control on 9/17 she has been having mental health symptoms of depression, panic attacks, feeling frustrated and "I cant think straight". Pt reports she feels "disoriented" and reports she is having a hard time functioning. Pt not sure if coming off her BC has anything to do with worsening symptoms but reports she can not think of anything else that could be causing her to have symptoms. Pt went to see her gynecologist today who recommended pt come to [facility] due to "mental health crisis". Pt taking medications (celexa and hydroxyzine) for anxiety prescribed by her PCP. Pt is being referred to [facility] for med mgt and she is also interested in therapy. Pt denies SI, HI, AVH and substance use. Pt is routine .

## 2020-12-16 NOTE — Telephone Encounter (Signed)
Patient's mother calling back in. Scheduled for next Tuesday with Worthy Rancher but will call in for cancellations each day

## 2020-12-16 NOTE — ED Notes (Signed)
Pt discharged with  AVS.  AVS reviewed prior to discharge.  Pt alert, oriented, and ambulatory.  Safety maintained.  °

## 2020-12-19 ENCOUNTER — Telehealth: Payer: Self-pay | Admitting: Physician Assistant

## 2020-12-19 ENCOUNTER — Other Ambulatory Visit: Payer: Self-pay

## 2020-12-19 ENCOUNTER — Encounter: Payer: Self-pay | Admitting: Physician Assistant

## 2020-12-19 ENCOUNTER — Ambulatory Visit (INDEPENDENT_AMBULATORY_CARE_PROVIDER_SITE_OTHER): Payer: 59 | Admitting: Physician Assistant

## 2020-12-19 VITALS — BP 112/65 | HR 69 | Ht 63.0 in | Wt 106.0 lb

## 2020-12-19 DIAGNOSIS — F422 Mixed obsessional thoughts and acts: Secondary | ICD-10-CM

## 2020-12-19 DIAGNOSIS — F411 Generalized anxiety disorder: Secondary | ICD-10-CM

## 2020-12-19 MED ORDER — CITALOPRAM HYDROBROMIDE 20 MG PO TABS: 20.0000 mg | ORAL_TABLET | Freq: Every day | ORAL | 1 refills | Status: DC

## 2020-12-19 NOTE — Telephone Encounter (Signed)
Stacey Foley was seen today, and forgot to inform [name] about a note to return to work. She stated in her message she would like the note to state out of work until the end of next Friday when she is seen by her primary physician.

## 2020-12-19 NOTE — Progress Notes (Signed)
[facility] Med Check  Patient ID: Stacey Foley,  MRN: [id number]  PCP: McLean-Scocuzza, Pasty Spillers, MD  Date of Evaluation: 12/19/2020 Time spent:45 minutes  Chief Complaint:  Chief Complaint   Anxiety; Depression; Insomnia; Follow-up     HISTORY/CURRENT STATUS: HPI  For depression/anxiety. Not seen since 04/2018.  For 2 weeks or so now, has anhedonia, brain fog, low motivation, isolatated the first few days of this episode. Has problems w/ taking care of herself, not doing skin care, she loves skin care so that's very different. Cries easily. Had a melt down at work, earlier this week, went to her GYN, the dr told her she was having a mental health crisis, her mom went and picked her up, took her to [facility] as her GYN recommended.  Evaluated and didn't need to be admitted, was told to come here or see her PCP. Also had to go home from work one day this week b/c of a meltdown. Her GYN wrote her out of work for a week.  Until this episode that began a few weeks ago, she was in a good place with her mental health.  Denies SI/HI.   Does have anxiety, was put on Celexa and Hydroxyzine about a year ago by her PCP for that. Hasn't had a PA in about a year until earlier this week.  No known trigger.  She obsesses about everything, cannot get things out of her head, most of the time it out of her control anyway.   She has always been a very methodical person but did not obsess about things like she is now.  Had a sore throat several weeks ago, went to urgent care, COVID strep test were negative.  Ruminating thoughts keep her from relaxing and going to sleep.  Once she is asleep then she usually can stay asleep.  Takes tranquil sleep which does help.  Patient denies increased energy with decreased need for sleep, no increased talkativeness, no racing thoughts, no impulsivity or risky behaviors, no increased spending, no increased libido, no grandiosity, no increased irritability or anger, and no  hallucinations.   Review of Systems  Constitutional: Negative.   HENT: Negative.    Eyes: Negative.   Respiratory: Negative.    Cardiovascular: Negative.   Gastrointestinal: Negative.   Genitourinary: Negative.   Musculoskeletal: Negative.   Skin: Negative.   Neurological: Negative.   Endo/Heme/Allergies: Negative.   Psychiatric/Behavioral:         See HPI.    Individual Medical History/ Review of Systems: Changes? :Yes   IBS  Past medications for mental health diagnoses include: Melatonin, questionable ADHD meds  Allergies: Peanut-containing drug products  Current Medications:  Current Outpatient Medications:    cholecalciferol (VITAMIN D3) 25 MCG (1000 UNIT) tablet, Take 1,000 Units by mouth daily., Disp: , Rfl:    citalopram (CELEXA) 20 MG tablet, Take 1 tablet (20 mg total) by mouth daily., Disp: 30 tablet, Rfl: 1   hydrOXYzine (ATARAX/VISTARIL) 25 MG tablet, Take 0.5-1 tablets (12.5-25 mg total) by mouth daily as needed for anxiety., Disp: 90 tablet, Rfl: 3   Probiotic Product (PROBIOTIC DAILY PO), Take by mouth., Disp: , Rfl:    Clotrimazole 1 % OINT, Apply 1 application topically 2 (two) times daily as needed. (Patient not taking: No sig reported), Disp: 60 g, Rfl: 0   drospirenone-ethinyl estradiol (YASMIN) 3-0.03 MG tablet, See admin instructions. (Patient not taking: No sig reported), Disp: , Rfl:    mupirocin ointment (BACTROBAN) 2 %, Apply 1 application  topically 3 (three) times daily as needed. (Patient not taking: No sig reported), Disp: 30 g, Rfl: 0 Medication Side Effects: none  Family Medical/ Social History: Changes? Works at [employer] and [employer]. Lives at home with parents.   MENTAL HEALTH EXAM:  Blood pressure 112/65, pulse 69, height 5\' 3"  (1.6 m), weight 106 lb (48.1 kg), last menstrual period 11/25/2020.Body mass index is 18.78 kg/m.  General Appearance: Casual and Well Groomed  Eye Contact:  Good  Speech:  Clear and Coherent  Volume:   Normal  Mood:  Anxious  Affect:  Constricted and Anxious  Thought Process:  Goal Directed  Orientation:  Full (Time, Place, and Person)  Thought Content: Logical   Suicidal Thoughts:  No  Homicidal Thoughts:  No  Memory:  WNL  Judgement:  Good  Insight:  Good  Psychomotor Activity:  Normal  Concentration:  Concentration: Good  Recall:  Good  Fund of Knowledge: Good  Language: Good  Assets:  Desire for Improvement  ADL's:  Intact  Cognition: WNL  Prognosis:  Good    DIAGNOSES:    ICD-10-CM   1. Mixed obsessional thoughts and acts  F42.2     2. Generalized anxiety disorder  F41.1        Receiving Psychotherapy: Yes    RECOMMENDATIONS:  PDMP reviewed.  Oxycodone given 07/17/2020 and again 07/22/2020.  No more recent controlled substances. I provided 45 minutes of face to face time during this encounter, including time spent before and after the visit in records review, complex medical decision making, counseling pertinent to today's visit, and charting.  We discussed the new diagnosis of obsessive-compulsive disorder.  Briefly touched on PANDAS, although even if that is what we are dealing with, the treatment will be the same for OCD.  I only mention it for their awareness.  They understand there has been links between recent strep infections with OCD, although her strep test was negative.  At any rate she needs to be treated for the OCD.  It is most likely she had this prior to the infection but for some reason her symptoms are worse right now. Initially I would recommend increasing the Celexa as she is on a low dose.  That can help with anxiety, depression and possibly OCD although used off label.  Briefly discussed Luvox as the only FDA approved medication for OCD, but the other SSRIs can work just as well at high enough doses.  Since she is on Celexa already and tolerating it well I would increase that first before we consider changing to something else.  Patient and her mom  agree.  She will also continue hydroxyzine use as needed. She has an appointment to see her PCP 1 day next week to make sure her symptoms are not caused by a physical medical problem. Increase Celexa to 20 mg, 1 p.o. daily. Continue hydroxyzine 25 mg, 1/2-1 p.o. 3 times daily as needed anxiety. Continue therapy. Return in 4 to 6 weeks.  09/21/2020, PA-C

## 2020-12-19 NOTE — Telephone Encounter (Signed)
Note was written, Sheralyn Boatman gave to patient's dad

## 2020-12-19 NOTE — Telephone Encounter (Signed)
Yes, I can either fax a note to her employer (need #) or she or her mom can come pick it up. I'll keep her out until 10/17.

## 2020-12-19 NOTE — Telephone Encounter (Signed)
Please review

## 2020-12-19 NOTE — Telephone Encounter (Signed)
Dad will come pick it up today

## 2020-12-23 ENCOUNTER — Encounter: Payer: Self-pay | Admitting: Family

## 2020-12-23 ENCOUNTER — Other Ambulatory Visit: Payer: Self-pay

## 2020-12-23 ENCOUNTER — Ambulatory Visit (INDEPENDENT_AMBULATORY_CARE_PROVIDER_SITE_OTHER): Payer: 59 | Admitting: Family

## 2020-12-23 VITALS — BP 112/78 | HR 74 | Temp 96.3°F | Ht 62.99 in | Wt 105.6 lb

## 2020-12-23 DIAGNOSIS — E559 Vitamin D deficiency, unspecified: Secondary | ICD-10-CM | POA: Diagnosis not present

## 2020-12-23 DIAGNOSIS — F411 Generalized anxiety disorder: Secondary | ICD-10-CM | POA: Diagnosis not present

## 2020-12-23 DIAGNOSIS — R4189 Other symptoms and signs involving cognitive functions and awareness: Secondary | ICD-10-CM | POA: Diagnosis not present

## 2020-12-23 DIAGNOSIS — R479 Unspecified speech disturbances: Secondary | ICD-10-CM

## 2020-12-23 DIAGNOSIS — Z8616 Personal history of COVID-19: Secondary | ICD-10-CM

## 2020-12-23 LAB — POCT URINE PREGNANCY: Preg Test, Ur: NEGATIVE

## 2020-12-23 NOTE — Progress Notes (Signed)
Acute Office Visit  Subjective:    Patient ID: Stacey Foley, female    DOB: 03-16-2000, 20 y.o.   MRN: [id number]  Chief Complaint  Patient presents with   Panic Attack    Pt states she stopped taking her birth control on 9/17 and has since been feeling as though her hormones are off . Pt states she has had 3 panic attacks and feels as though she's more hyper aware and anxious about things. Pt reported symptoms of insomnia, feeling lethargic, struggling to do simple task    HPI Patient is in today with c/o feeling like she has brain fog, increased anxiety, and panic attacks. Also reports having difficulty falling and maintaining sleep. She has tried taking Melatonin that has not helped. Feels she is struggling to complete simple tasks. She has been out of work since April. Reports having COVID in August. Stopped her birth control Sept 17th and wonders if her hormones are not well controlled. She has seen a therapist and psychiatry that increased her Celexa to 20 mg. They plan to start Luvox if this does not work well. She is concerned that there is something wrong with her brain. Has difficulty speaking at times, unable to get the words out of her mouth. Mother also reports an episode of her writing her name when she was supposed to be writing her Father's name. Has difficulty with enjoying once enjoyable activities. Finds herself crying more than usual  Past Medical History:  Diagnosis Date   Abdominal pain    Anxiety    Anxiety    COVID-19    10/22/20   Nausea     Past Surgical History:  Procedure Laterality Date   KNEE SURGERY Right    TONSILLECTOMY     WISDOM TOOTH EXTRACTION      Family History  Problem Relation Age of Onset   Healthy Mother    Hypertension Father     Social History   Socioeconomic History   Marital status: Single    Spouse name: Not on file   Number of children: Not on file   Years of education: Not on file   Highest education level: Not on  file  Occupational History   Not on file  Tobacco Use   Smoking status: Former    Types: E-cigarettes   Smokeless tobacco: Never  Vaping Use   Vaping Use: Every day  Substance and Sexual Activity   Alcohol use: Never   Drug use: Never   Sexual activity: Not on file  Other Topics Concern   Not on file  Social History Narrative   Will finish school in [employer] summer 2021    Social Determinants of Health   Financial Resource Strain: Not on file  Food Insecurity: Not on file  Transportation Needs: Not on file  Physical Activity: Not on file  Stress: Not on file  Social Connections: Not on file  Intimate Partner Violence: Not on file    Outpatient Medications Prior to Visit  Medication Sig Dispense Refill   cholecalciferol (VITAMIN D3) 25 MCG (1000 UNIT) tablet Take 1,000 Units by mouth daily.     citalopram (CELEXA) 20 MG tablet Take 1 tablet (20 mg total) by mouth daily. 30 tablet 1   hydrOXYzine (ATARAX/VISTARIL) 25 MG tablet Take 0.5-1 tablets (12.5-25 mg total) by mouth daily as needed for anxiety. 90 tablet 3   mupirocin ointment (BACTROBAN) 2 % Apply 1 application topically 3 (three) times daily as needed. 30 g  0   Probiotic Product (PROBIOTIC DAILY PO) Take by mouth.     Clotrimazole 1 % OINT Apply 1 application topically 2 (two) times daily as needed. (Patient not taking: No sig reported) 60 g 0   drospirenone-ethinyl estradiol (YASMIN) 3-0.03 MG tablet See admin instructions. (Patient not taking: No sig reported)     No facility-administered medications prior to visit.    Allergies  Allergen Reactions   Peanut-Containing Drug Products     Review of Systems  Constitutional:  Positive for fatigue. Negative for unexpected weight change.  HENT:  Positive for postnasal drip. Negative for sinus pressure and sinus pain.   Eyes:  Positive for photophobia. Negative for visual disturbance.  Respiratory:  Positive for cough.   Cardiovascular: Negative.  Negative for  chest pain.  Gastrointestinal: Negative.   Genitourinary: Negative.   Musculoskeletal: Negative.   Skin: Negative.   Allergic/Immunologic: Negative.   Hematological: Negative.   Psychiatric/Behavioral:  Positive for confusion, decreased concentration and sleep disturbance. Negative for suicidal ideas. The patient is nervous/anxious.       Objective:    Physical Exam Vitals reviewed. Exam conducted with a chaperone present (mother present).  Constitutional:      Appearance: Normal appearance.  HENT:     Right Ear: Tympanic membrane, ear canal and external ear normal.     Left Ear: Tympanic membrane, ear canal and external ear normal.     Mouth/Throat:     Mouth: Mucous membranes are moist.  Eyes:     Pupils: Pupils are equal, round, and reactive to light.  Cardiovascular:     Rate and Rhythm: Normal rate and regular rhythm.  Pulmonary:     Effort: Pulmonary effort is normal.     Breath sounds: Normal breath sounds.  Musculoskeletal:        General: Normal range of motion.     Cervical back: Normal range of motion and neck supple.  Skin:    General: Skin is warm and dry.  Neurological:     General: No focal deficit present.     Mental Status: She is alert and oriented to person, place, and time.  Psychiatric:        Mood and Affect: Mood normal.     Comments: Anxious on exam   BP 112/78   Pulse 74   Temp (!) 96.3 F (35.7 C)   Ht 5' 2.99" (1.6 m)   Wt 105 lb 9.6 oz (47.9 kg)   LMP 11/25/2020 (Exact Date)   SpO2 99%   BMI 18.71 kg/m  Wt Readings from Last 3 Encounters:  12/23/20 105 lb 9.6 oz (47.9 kg) (9 %, Z= -1.35)*  05/12/20 110 lb (49.9 kg) (16 %, Z= -0.98)*  10/05/19 105 lb 12.8 oz (48 kg) (11 %, Z= -1.22)*   * Growth percentiles are based on CDC (Girls, 2-20 Years) data.    Health Maintenance Due  Topic Date Due   HIV Screening  Never done   Hepatitis C Screening  Never done    There are no preventive care reminders to display for this  patient.   Lab Results  Component Value Date   TSH 3.27 06/18/2019   Lab Results  Component Value Date   WBC 7.2 06/18/2019   HGB 13.5 06/18/2019   HCT 39.9 06/18/2019   MCV 94.0 06/18/2019   PLT 283.0 06/18/2019   Lab Results  Component Value Date   NA 136 06/18/2019   K 4.1 06/18/2019   CO2 26  06/18/2019   GLUCOSE 87 06/18/2019   BUN 11 06/18/2019   CREATININE 0.89 06/18/2019   BILITOT 0.4 06/18/2019   ALKPHOS 47 06/18/2019   AST 13 06/18/2019   ALT 8 06/18/2019   PROT 7.4 06/18/2019   ALBUMIN 4.2 06/18/2019   CALCIUM 9.6 06/18/2019   ANIONGAP 9 09/23/2017   GFR 82.35 06/18/2019   No results found for: CHOL No results found for: HDL No results found for: LDLCALC No results found for: TRIG No results found for: CHOLHDL No results found for: HGBA1C     Assessment & Plan:   Problem List Items Addressed This Visit     Generalized anxiety disorder - Primary   Relevant Orders   Comp Met (CMET)   CBC w/Diff   Thyroid Panel With TSH   POCT urine pregnancy   Vitamin D (25 hydroxy)   B12   Other Visit Diagnoses     Brain fog       Relevant Orders   CT HEAD WO CONTRAST (5MM)   Comp Met (CMET)   CBC w/Diff   Thyroid Panel With TSH   POCT urine pregnancy   Vitamin D (25 hydroxy)   B12   Difficulty speaking       Relevant Orders   CT HEAD WO CONTRAST (5MM)   Comp Met (CMET)   CBC w/Diff   Thyroid Panel With TSH   POCT urine pregnancy   Vitamin D (25 hydroxy)   B12   Vitamin D deficiency       Relevant Orders   Comp Met (CMET)   CBC w/Diff   Thyroid Panel With TSH   POCT urine pregnancy   Vitamin D (25 hydroxy)   B12       Underlying issues appears to be anxiety however, we need to rule out any medical cause. Labs obtained today. CT scan ordered as well to address brain fog. Will follow-up pending labs and sooner as needed.    Kennyth Arnold, FNP

## 2020-12-24 ENCOUNTER — Ambulatory Visit
Admission: RE | Admit: 2020-12-24 | Discharge: 2020-12-24 | Disposition: A | Discharge status: Home / Self Care | Payer: 59 | Source: Ambulatory Visit | Attending: Family | Admitting: Family

## 2020-12-24 ENCOUNTER — Other Ambulatory Visit: Payer: Self-pay

## 2020-12-24 DIAGNOSIS — R479 Unspecified speech disturbances: Secondary | ICD-10-CM | POA: Insufficient documentation

## 2020-12-24 DIAGNOSIS — R4189 Other symptoms and signs involving cognitive functions and awareness: Secondary | ICD-10-CM

## 2020-12-24 LAB — CBC WITH DIFFERENTIAL/PLATELET
Basophils Absolute: 0.1 10*3/uL (ref 0.0–0.1)
Basophils Relative: 0.8 % (ref 0.0–3.0)
Eosinophils Absolute: 0.1 10*3/uL (ref 0.0–0.7)
Eosinophils Relative: 1.3 % (ref 0.0–5.0)
HCT: 37.5 % (ref 36.0–49.0)
Hemoglobin: 12.4 g/dL (ref 12.0–16.0)
Lymphocytes Relative: 26.9 % (ref 24.0–48.0)
Lymphs Abs: 1.8 10*3/uL (ref 0.7–4.0)
MCHC: 33.1 g/dL (ref 31.0–37.0)
MCV: 93.9 fl (ref 78.0–98.0)
Monocytes Absolute: 0.5 10*3/uL (ref 0.1–1.0)
Monocytes Relative: 7.9 % (ref 3.0–12.0)
Neutro Abs: 4.2 10*3/uL (ref 1.4–7.7)
Neutrophils Relative %: 63.1 % (ref 43.0–71.0)
Platelets: 239 10*3/uL (ref 150.0–575.0)
RBC: 3.99 Mil/uL (ref 3.80–5.70)
RDW: 13.8 % (ref 11.4–15.5)
WBC: 6.7 10*3/uL (ref 4.5–13.5)

## 2020-12-24 LAB — VITAMIN B12: Vitamin B-12: 298 pg/mL (ref 211–911)

## 2020-12-24 LAB — COMPREHENSIVE METABOLIC PANEL
ALT: 12 U/L (ref 0–35)
AST: 16 U/L (ref 0–37)
Albumin: 4.2 g/dL (ref 3.5–5.2)
Alkaline Phosphatase: 52 U/L (ref 47–119)
BUN: 11 mg/dL (ref 6–23)
CO2: 26 mEq/L (ref 19–32)
Calcium: 9.6 mg/dL (ref 8.4–10.5)
Chloride: 101 mEq/L (ref 96–112)
Creatinine, Ser: 0.76 mg/dL (ref 0.40–1.20)
GFR: 113.29 mL/min (ref >60.00)
Glucose, Bld: 83 mg/dL (ref 70–99)
Potassium: 4.1 mEq/L (ref 3.5–5.1)
Sodium: 137 mEq/L (ref 135–145)
Total Bilirubin: 0.6 mg/dL (ref 0.2–1.2)
Total Protein: 6.9 g/dL (ref 6.0–8.3)

## 2020-12-24 LAB — VITAMIN D 25 HYDROXY (VIT D DEFICIENCY, FRACTURES): VITD: 72.44 ng/mL (ref 30.00–100.00)

## 2020-12-24 LAB — THYROID PANEL WITH TSH
Free Thyroxine Index: 2.2 (ref 1.4–3.8)
T3 Uptake: 30 % (ref 22–35)
T4, Total: 7.4 ug/dL (ref 5.3–11.7)
TSH: 1.65 mIU/L

## 2020-12-27 ENCOUNTER — Encounter: Payer: Self-pay | Admitting: Internal Medicine

## 2020-12-30 ENCOUNTER — Ambulatory Visit: Payer: 59 | Admitting: Family

## 2020-12-31 ENCOUNTER — Encounter: Payer: Self-pay | Admitting: Internal Medicine

## 2021-01-05 ENCOUNTER — Telehealth: Payer: Self-pay | Admitting: Physician Assistant

## 2021-01-05 ENCOUNTER — Telehealth: Payer: Self-pay | Admitting: Internal Medicine

## 2021-01-05 NOTE — Telephone Encounter (Signed)
I spoke to her mom and she stated she is aware to go to [facility] or [place] if she can't control the suicidal thoughts.She stated they just started a couple days ago and thinks it's related to the Celexa increase to 20 mg.She is also having some brain fog.They want to reduce to 15 mg.

## 2021-01-05 NOTE — Telephone Encounter (Signed)
Request MRI, Pt had CT scan done last week. Pt still having symptoms fogginess, lost of memory

## 2021-01-05 NOTE — Telephone Encounter (Signed)
Yes decrease Celexa to 15 mg.  If they can cut the current 20 mg tablet into 1/4s, take 3/4.  If not, let me know and I will send in a prescription for 10 mg and then will take 1.5 pills of that.  Thank you.

## 2021-01-05 NOTE — Telephone Encounter (Signed)
Mom informed.

## 2021-01-05 NOTE — Telephone Encounter (Signed)
Pt left a message that 2 weeks ago teresa doubled her meds. Claiborne Rigg the last few days she has had suicdal thoughts. She thinks it is from the double dose of meds. She would like to go down to 15 mg if teresa agrees.  Please call her at [phone]. If you can't reach her you can call her mom at [phone]

## 2021-01-06 ENCOUNTER — Other Ambulatory Visit: Payer: Self-pay | Admitting: Family

## 2021-01-06 DIAGNOSIS — R4189 Other symptoms and signs involving cognitive functions and awareness: Secondary | ICD-10-CM

## 2021-01-06 NOTE — Telephone Encounter (Signed)
Received a call from Stacey Foley's mother Stacey Foley asking if a MRI can be ordered for Stacey Foley. She states that Stacey Foley has continued to have a brain fog and confusion and the CT Impression recommended considering an MRI for a more sensitive evaluation:  IMPRESSION: No evidence of acute intracranial abnormality. If the patient's symptoms continue, consider MRI more sensitive evaluation.  Stacey Foley is aware that a neurology referral has been made and asks if neurology orders the MRI or if it should come from our office. Stacey Foley asks for a call back as Stacey Foley is attempting to work and can not answer the phone.

## 2021-01-07 ENCOUNTER — Ambulatory Visit
Admission: RE | Admit: 2021-01-07 | Discharge: 2021-01-07 | Disposition: A | Discharge status: Home / Self Care | Payer: 59 | Source: Ambulatory Visit | Attending: Internal Medicine | Admitting: Internal Medicine

## 2021-01-07 ENCOUNTER — Other Ambulatory Visit: Payer: Self-pay

## 2021-01-07 ENCOUNTER — Telehealth: Payer: Self-pay | Admitting: Internal Medicine

## 2021-01-07 ENCOUNTER — Telehealth: Payer: Self-pay | Admitting: Physician Assistant

## 2021-01-07 DIAGNOSIS — R479 Unspecified speech disturbances: Secondary | ICD-10-CM | POA: Insufficient documentation

## 2021-01-07 DIAGNOSIS — Z8616 Personal history of COVID-19: Secondary | ICD-10-CM | POA: Diagnosis present

## 2021-01-07 DIAGNOSIS — R4189 Other symptoms and signs involving cognitive functions and awareness: Secondary | ICD-10-CM | POA: Insufficient documentation

## 2021-01-07 MED ORDER — GADOBUTROL 1 MMOL/ML IV SOLN
5.0000 mL | Freq: Once | INTRAVENOUS | Status: AC | PRN
  Administered 2021-01-07: 5 mL via INTRAVENOUS

## 2021-01-07 NOTE — Telephone Encounter (Signed)
Mom called and  lm that she would like to talk to the nurse again about maddie. She is having an MRI done for brain fog. Please call courtney at [phone]

## 2021-01-07 NOTE — Telephone Encounter (Signed)
Patient has already been notified and will reach out to psychiatry and the therapist to discuss treatment and medication changes. They are awaiting the call from Neurology.

## 2021-01-07 NOTE — Telephone Encounter (Signed)
Called mom and relayed the information. She states patient had been doing well on the 10 mg Celexa until she had stopped her birth control. She states patient does have a counselor that she sees every Monday. She said she has been to the [facility] so she is familiar with location. Relayed information about trying to get her in sooner.

## 2021-01-07 NOTE — Addendum Note (Signed)
Addended by: Quentin Ore on: 01/07/2021 08:47 AM   Modules accepted: Orders

## 2021-01-07 NOTE — Telephone Encounter (Signed)
Have her decrease the Celexa to 10 mg for 4 days, 5 mg for 4 days and then stop it. Have her mom call [facility], they have counselors there so hopefully she can get in sooner.  Of course if the suicidal thoughts worsen or she has a plan, she needs to go to [facility] immediately. Sarah please get her scheduled in one of my urgent care spots in the next week or so if possible.  Let me know if I do not have any thing and I will look at my schedule and see where I could work her in. Thanks.

## 2021-01-07 NOTE — Telephone Encounter (Signed)
Patients mother is returning your call.Please call her at [phone].

## 2021-01-07 NOTE — Telephone Encounter (Signed)
Noted  

## 2021-01-07 NOTE — Telephone Encounter (Signed)
Stacey Foley's mom returned the call asking again for an MRI. She states that Stacey Foley is having issues with memory and is having trouble doing her job properly. Verbal instruction has been given By Dr. French Ana Mclean-Scocuzza to inform Stacey Foley that the MRI has been ordered and that Stacey Foley needs to follow up with Psychiatry and Neurology.   Stacey Foley has been informed and states that Stacey Foley has a Therapist, sports at [facility] in [place]. She states that she is calling for Stacey Foley to get an appointment and adjust the medication. Stacey Foley also has a Paramedic and is going to schedule a follow up with them as well.

## 2021-01-07 NOTE — Telephone Encounter (Signed)
F/u psychiatry may need medication change  F/u therapy  MRI ordered with and without dye to light up brain and get a better look it is more enclosed than CT scan have pt close her eyes and MRIs are noisy F/u with [facility] neurology they will reach out for appt

## 2021-01-07 NOTE — Telephone Encounter (Signed)
Patient's mother returning referrals phone call.

## 2021-01-07 NOTE — Telephone Encounter (Signed)
Mom called and stated that patient is not doing well with the adjustment in Celexa.  Patient is taking 15 mg currently and is having stomach cramps, suicidal thoughts but is working with her on that. The brain fog and memory are worse. She again mentions the MRI because "things are not right." Realizes that the dose change has only been a couple of days but that she has been on Celexa for 3 weeks and thought there would have been some improvement.  Mom says that patient is unable to function at work, is not able to get her work done and will most likely contact her PCP for a work note.  Mom states she doesn't have an appt for a few months and wants to know if you want her to come in sooner.   Please advise. Thank you.

## 2021-01-10 ENCOUNTER — Other Ambulatory Visit: Payer: Self-pay | Admitting: Physician Assistant

## 2021-01-12 NOTE — Telephone Encounter (Signed)
90 day ok?

## 2021-01-13 DIAGNOSIS — F411 Generalized anxiety disorder: Secondary | ICD-10-CM | POA: Insufficient documentation

## 2021-01-15 ENCOUNTER — Other Ambulatory Visit: Payer: Self-pay

## 2021-01-15 ENCOUNTER — Encounter: Payer: Self-pay | Admitting: Physician Assistant

## 2021-01-15 ENCOUNTER — Ambulatory Visit (INDEPENDENT_AMBULATORY_CARE_PROVIDER_SITE_OTHER): Payer: 59 | Admitting: Physician Assistant

## 2021-01-15 DIAGNOSIS — Z8659 Personal history of other mental and behavioral disorders: Secondary | ICD-10-CM

## 2021-01-15 DIAGNOSIS — F422 Mixed obsessional thoughts and acts: Secondary | ICD-10-CM

## 2021-01-15 DIAGNOSIS — F411 Generalized anxiety disorder: Secondary | ICD-10-CM

## 2021-01-15 DIAGNOSIS — F331 Major depressive disorder, recurrent, moderate: Secondary | ICD-10-CM

## 2021-01-15 NOTE — Progress Notes (Signed)
[facility]  Patient ID: Stacey Foley,  MRN: [id number]  PCP: McLean-Scocuzza, Pasty Spillers, MD  Date of Evaluation: 01/15/2021 Time spent:40 minutes  Chief Complaint:  Chief Complaint   Anxiety; Depression; Follow-up      HISTORY/CURRENT STATUS: HPI  See urgently.   Had SI after increasing the Celexa 4 weeks ago.  Once after an issue with a friend.  States she will not hurt herself but feels so low that she does not want to do anything and would not mind if she was not here.  Her counselor recommended that she be seen by me sooner than sched appt. Celexa was decreased after the SI occurred, SI have improved. Now passive, occas.  She still has a hard time enjoying things.  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  That is very different for her, although she reported that at the visit a month ago.  It is a little worse than before.  She has not been able to work since a few days ago.  She did have almost associated symptoms while working on a client, forgetting the steps of a facial and what she is supposed to do next.  She saw her PCP recently due to the brain fog symptoms and was prescribed prednisone just in case these symptoms were post-COVID related.  Also prescribed Adderall but she has not started that yet.  States she does not have a lot of energy.  Does have feelings of hopelessness, they come and go.  Cries easily. At this point denies suicidal or homicidal thoughts.  She does still have anxiety, mostly ruminating thoughts, obsessing about her health, not being able to work or things like that.  She is able to sleep well most of the time.  Does take a sleep aid which helps.  Patient denies increased energy with decreased need for sleep, no increased talkativeness, no racing thoughts, no impulsivity or risky behaviors, no increased spending, no increased libido, no grandiosity, no increased irritability or anger, and no  hallucinations.  Will start partial hosp at [facility] for 4-6 wks, then go to IOP. She will have to use providers there, so her mom wanted to let me know.   Review of Systems  Constitutional:  Positive for malaise/fatigue.  HENT: Negative.    Eyes: Negative.   Respiratory: Negative.    Cardiovascular: Negative.   Gastrointestinal: Negative.   Genitourinary: Negative.   Musculoskeletal: Negative.   Skin: Negative.   Neurological:        See HPI  Endo/Heme/Allergies: Negative.   Psychiatric/Behavioral:         See HPI.    Individual Medical History/ Review of Systems: Changes? :Yes   IBS, neuro work up since LOV d/t brain fog likely caused by covid, long haul.   Past medications for mental health diagnoses include: Melatonin, questionable ADHD meds  Allergies: Peanut-containing drug products  Current Medications:  Current Outpatient Medications:    cholecalciferol (VITAMIN D3) 25 MCG (1000 UNIT) tablet, Take 1,000 Units by mouth daily., Disp: , Rfl:    citalopram (CELEXA) 20 MG tablet, Take 1 tablet (20 mg total) by mouth daily. (Patient taking differently: Take 10 mg by mouth daily.), Disp: 30 tablet, Rfl: 1   hydrOXYzine (ATARAX/VISTARIL) 25 MG tablet, Take 0.5-1 tablets (12.5-25 mg total) by mouth daily as needed for anxiety., Disp: 90 tablet, Rfl: 3   predniSONE (DELTASONE) 10 MG tablet, Take by mouth., Disp: ,  Rfl:    Probiotic Product (PROBIOTIC DAILY PO), Take by mouth., Disp: , Rfl:    mupirocin ointment (BACTROBAN) 2 %, Apply 1 application topically 3 (three) times daily as needed. (Patient not taking: Reported on 01/15/2021), Disp: 30 g, Rfl: 0   traZODone (DESYREL) 50 MG tablet, Take 0.5-2 tablets (25-100 mg total) by mouth at bedtime as needed for sleep., Disp: 30 tablet, Rfl: 1 Medication Side Effects: none  Family Medical/ Social History: Changes? Works at [employer] and [employer]. Lives at home with parents.   MENTAL HEALTH EXAM:  Last menstrual period  12/24/2020.There is no height or weight on file to calculate BMI.  General Appearance: Casual and Well Groomed  Eye Contact:  Good  Speech:  Clear and Coherent  Volume:  Normal  Mood:  Anxious  Affect:  Constricted and Anxious  Thought Process:  Goal Directed  Orientation:  Full (Time, Place, and Person)  Thought Content: Logical   Suicidal Thoughts:  No  Homicidal Thoughts:  No  Memory:  WNL  Judgement:  Good  Insight:  Good  Psychomotor Activity:  Normal  Concentration:  Concentration: Fair and Attention Span: Fair  Recall:  Good  Fund of Knowledge: Good  Language: Good  Assets:  Desire for Improvement  ADL's:  Intact  Cognition: WNL  Prognosis:  Good   Reviewed labs and CT from 12/23/2020, MRI brain from 01/07/2021   DIAGNOSES:    ICD-10-CM   1. Major depressive disorder, recurrent episode, moderate (HCC)  F33.1     2. Mixed obsessional thoughts and acts  F42.2     3. Generalized anxiety disorder  F41.1     4. History of suicidal ideation  Z86.59         Receiving Psychotherapy: Yes    RECOMMENDATIONS:  PDMP reviewed.  Oxycodone given 07/17/2020 and again 07/22/2020.  No more recent controlled substances. I provided 40 minutes of face to face time during this encounter, including time spent before and after the visit in records review, medical decision making, counseling pertinent to today's visit, and charting.  Contract for safety is in place.  Her mom knows to take her to [facility] if her condition worsens and suicidal thoughts are active.  Patient knows to call 72, our office, or go to the urgent care. No changes in medications will be made at this point. Continue Celexa 10 mg, 1 p.o. daily. Continue hydroxyzine 25 mg, 1/2-1 p.o. 3 times daily as needed anxiety. Continue therapy. Return prn.  (After partial hospitalization and IOP.)  Melony Overly, PA-C

## 2021-01-16 ENCOUNTER — Telehealth: Payer: Self-pay | Admitting: Physician Assistant

## 2021-01-16 NOTE — Telephone Encounter (Signed)
Please review

## 2021-01-16 NOTE — Telephone Encounter (Signed)
Dad called and said that they forgot to ask teresa at her appt yesterday that Stacey Foley needs sleep medicine. She is unable to sleep.Please send script in . You can call chris her father at [phone]

## 2021-01-19 ENCOUNTER — Other Ambulatory Visit: Payer: Self-pay | Admitting: Physician Assistant

## 2021-01-19 MED ORDER — TRAZODONE HCL 50 MG PO TABS: 25.0000 mg | ORAL_TABLET | Freq: Every evening | ORAL | 1 refills | Status: DC | PRN

## 2021-01-19 NOTE — Telephone Encounter (Signed)
[id number] Stacey Foley, [place] [phone] the hydroxyzine does not help,it's a hit or miss

## 2021-01-19 NOTE — Telephone Encounter (Signed)
Please see if she's tried the hydroxyzine to help sleep.  If she has not, try that first.  If she has and it is not effective, let me know and I will send in a prescription for trazodone.  Please confirm her pharmacy.  Thank you.

## 2021-01-19 NOTE — Telephone Encounter (Signed)
Prescription for trazodone was sent.

## 2021-01-21 ENCOUNTER — Telehealth: Payer: Self-pay | Admitting: Physician Assistant

## 2021-01-21 NOTE — Telephone Encounter (Signed)
Please review

## 2021-01-21 NOTE — Telephone Encounter (Signed)
Noted thanks I will give her a call.

## 2021-01-21 NOTE — Telephone Encounter (Signed)
Pt's mom LVM requesting a call back from Traci. (Mom is listed on DPR form).  Mom said Damia is in a partial hospital program for 4 wks.  They are requesting some type of letter that mom said she can describe to you when you return the call.  Next appt 1/3

## 2021-01-22 ENCOUNTER — Telehealth: Payer: Self-pay | Admitting: Physician Assistant

## 2021-01-22 NOTE — Telephone Encounter (Signed)
Rtc to Mom yesterday and she was giving [place] a head's up that where Maddie is doing her partial hospitalization program, [address]. They will be contacting [name] requesting pt's diagnosis, Mom mentioned OCD and what Maddie is struggling with. Pt did sign a release if [name] wants any records sent. They are wanting to treat pt but just want to have documentation on what that is for. Mom informed me it could be [facility] or [employer]. Informed her I had not heard or seen anything yet but if I didn't get a call or fax that I would reach out to them for clarification on request.   She was appreciative of follow up call. Told her I would keep her posted.

## 2021-01-22 NOTE — Telephone Encounter (Signed)
Records were faxed to [facility] today 11/10.

## 2021-01-22 NOTE — Telephone Encounter (Signed)
I received a request for records (I am not sure exactly what they were asking for) that I ok'ed to be faxed to [facility].

## 2021-01-23 NOTE — Telephone Encounter (Signed)
Noted I will contact Mom

## 2021-01-26 ENCOUNTER — Emergency Department
Admission: EM | Admit: 2021-01-26 | Discharge: 2021-01-26 | Disposition: A | Discharge status: Home / Self Care | Payer: 59 | Attending: Emergency Medicine | Admitting: Emergency Medicine

## 2021-01-26 ENCOUNTER — Other Ambulatory Visit: Payer: Self-pay

## 2021-01-26 ENCOUNTER — Other Ambulatory Visit: Payer: Self-pay | Admitting: Physician Assistant

## 2021-01-26 DIAGNOSIS — Z91010 Allergy to peanuts: Secondary | ICD-10-CM | POA: Diagnosis not present

## 2021-01-26 DIAGNOSIS — F419 Anxiety disorder, unspecified: Secondary | ICD-10-CM | POA: Diagnosis not present

## 2021-01-26 DIAGNOSIS — F422 Mixed obsessional thoughts and acts: Secondary | ICD-10-CM

## 2021-01-26 DIAGNOSIS — G6289 Other specified polyneuropathies: Secondary | ICD-10-CM | POA: Diagnosis not present

## 2021-01-26 DIAGNOSIS — Z8616 Personal history of COVID-19: Secondary | ICD-10-CM | POA: Insufficient documentation

## 2021-01-26 DIAGNOSIS — Z79899 Other long term (current) drug therapy: Secondary | ICD-10-CM | POA: Diagnosis not present

## 2021-01-26 DIAGNOSIS — G629 Polyneuropathy, unspecified: Secondary | ICD-10-CM

## 2021-01-26 DIAGNOSIS — Z87891 Personal history of nicotine dependence: Secondary | ICD-10-CM | POA: Insufficient documentation

## 2021-01-26 LAB — URINALYSIS, COMPLETE (UACMP) WITH MICROSCOPIC
Bilirubin Urine: NEGATIVE
Glucose, UA: NEGATIVE mg/dL
Hgb urine dipstick: NEGATIVE
Ketones, ur: NEGATIVE mg/dL
Leukocytes,Ua: NEGATIVE
Nitrite: NEGATIVE
Protein, ur: NEGATIVE mg/dL
Specific Gravity, Urine: 1.019 (ref 1.005–1.030)
pH: 6 (ref 5.0–8.0)

## 2021-01-26 LAB — CBC
HCT: 42.2 % (ref 36.0–46.0)
Hemoglobin: 14.3 g/dL (ref 12.0–15.0)
MCH: 31.7 pg (ref 26.0–34.0)
MCHC: 33.9 g/dL (ref 30.0–36.0)
MCV: 93.6 fL (ref 80.0–100.0)
Platelets: 255 10*3/uL (ref 150–400)
RBC: 4.51 MIL/uL (ref 3.87–5.11)
RDW: 12.5 % (ref 11.5–15.5)
WBC: 7.7 10*3/uL (ref 4.0–10.5)
nRBC: 0 % (ref 0.0–0.2)

## 2021-01-26 LAB — COMPREHENSIVE METABOLIC PANEL
ALT: 13 U/L (ref 0–44)
AST: 18 U/L (ref 15–41)
Albumin: 4.4 g/dL (ref 3.5–5.0)
Alkaline Phosphatase: 50 U/L (ref 38–126)
Anion gap: 6 (ref 5–15)
BUN: 19 mg/dL (ref 6–20)
CO2: 27 mmol/L (ref 22–32)
Calcium: 9.5 mg/dL (ref 8.9–10.3)
Chloride: 102 mmol/L (ref 98–111)
Creatinine, Ser: 0.75 mg/dL (ref 0.44–1.00)
GFR, Estimated: >60 mL/min (ref >60)
Glucose, Bld: 104 mg/dL — ABNORMAL HIGH (ref 70–99)
Potassium: 3.9 mmol/L (ref 3.5–5.1)
Sodium: 135 mmol/L (ref 135–145)
Total Bilirubin: 1.1 mg/dL (ref 0.3–1.2)
Total Protein: 7.8 g/dL (ref 6.5–8.1)

## 2021-01-26 LAB — POC URINE PREG, ED: Preg Test, Ur: NEGATIVE

## 2021-01-26 NOTE — ED Triage Notes (Signed)
First Nurse Note:  Arrives with father for ED evaluation.  Patient receives outpatient mental health care and today c/o slurred speech and tingling sensation to hands and feet.  AAOx3.  Skin warm and dry.  MAE equally and strong.  Gait steady.  Posture upright and relaxed. Speech clear. NAD

## 2021-01-26 NOTE — ED Provider Notes (Signed)
[facility] Emergency Department Provider Note ____________________________________________   Event Date/Time   First MD Initiated Contact with Patient 01/26/21 1241     (approximate)  I have reviewed the triage vital signs and the nursing notes.   HISTORY  Chief Complaint medication changes  HPI Stacey Foley is a 19 y.o. female with history of anxiety, ADHD, depression, OCD and remaining history as listed below presents to the emergency department for treatment of multiple medical complaints.  She has been working with primary care as well as a partial hospitalization mental health facility in [place].  She states that since August she has been diagnosed with OCD, ADHD after having COVID. Symptoms this morning were concerning because parents state that she was having trouble speaking. This has sense resolved. Her main complaint at this time is bilateral lower extremity tingling and states they hurt when she stands. This is not new today. She is also feeling anxious and overwhelmed.         Past Medical History:  Diagnosis Date   Abdominal pain    Anxiety    Anxiety    COVID-19    10/22/20   Nausea    OCD (obsessive compulsive disorder)     Patient Active Problem List   Diagnosis Date Noted   Irritable bowel syndrome with both constipation and diarrhea 09/11/2019   Anxiety and depression 09/11/2019   Insomnia 05/03/2019   Generalized anxiety disorder 12/03/2017   Abdominal pain    Nausea     Past Surgical History:  Procedure Laterality Date   KNEE SURGERY Right    TONSILLECTOMY     WISDOM TOOTH EXTRACTION      Prior to Admission medications   Medication Sig Start Date End Date Taking? Authorizing Provider  cholecalciferol (VITAMIN D3) 25 MCG (1000 UNIT) tablet Take 1,000 Units by mouth daily.    [provider]  citalopram (CELEXA) 20 MG tablet Take 1 tablet (20 mg total) by mouth daily. Patient taking differently: Take 10  mg by mouth daily. 12/19/20   Melony Overly T, PA-C  hydrOXYzine (ATARAX/VISTARIL) 25 MG tablet Take 0.5-1 tablets (12.5-25 mg total) by mouth daily as needed for anxiety. 10/12/20   McLean-Scocuzza, Pasty Spillers, MD  mupirocin ointment (BACTROBAN) 2 % Apply 1 application topically 3 (three) times daily as needed. Patient not taking: Reported on 01/15/2021 10/05/19   McLean-Scocuzza, Pasty Spillers, MD  predniSONE (DELTASONE) 10 MG tablet Take by mouth. 01/13/21   [provider]  Probiotic Product (PROBIOTIC DAILY PO) Take by mouth.    [provider]  traZODone (DESYREL) 50 MG tablet Take 0.5-2 tablets (25-100 mg total) by mouth at bedtime as needed for sleep. 01/19/21   Cherie Ouch, PA-C    Allergies Peanut-containing drug products  Family History  Problem Relation Age of Onset   Healthy Mother    Hypertension Father     Social History Social History   Tobacco Use   Smoking status: Former    Types: E-cigarettes   Smokeless tobacco: Never  Building services engineer Use: Every day  Substance Use Topics   Alcohol use: Never   Drug use: Never    Review of Systems  Constitutional: No fever/chills Eyes: No visual changes. ENT: No sore throat. Cardiovascular: Denies chest pain. Respiratory: Denies shortness of breath. Gastrointestinal: No abdominal pain.  No nausea, no vomiting.  No diarrhea.  No constipation. Genitourinary: Negative for dysuria. Musculoskeletal: Negative for back pain. Skin: Negative for rash.  Neurological: Negative for headaches, focal weakness or numbness. Psychiatric: Positive for anxiety.  ____________________________________________   PHYSICAL EXAM:  VITAL SIGNS: ED Triage Vitals  Enc Vitals Group     BP 01/26/21 1102 126/83     Pulse Rate 01/26/21 1102 (!) 107     Resp 01/26/21 1102 16     Temp 01/26/21 1102 98.2 F (36.8 C)     Temp Source 01/26/21 1102 Oral     SpO2 01/26/21 1102 98 %     Weight 01/26/21 1106 100 lb (45.4 kg)     Height  01/26/21 1106 5\' 2"  (1.575 m)     Head Circumference --      Peak Flow --      Pain Score 01/26/21 1106 0     Pain Loc --      Pain Edu? --      Excl. in GC? --     Constitutional: Alert and oriented. Well appearing and in no acute distress. Eyes: Conjunctivae are normal. PERRL. EOMI. Head: Atraumatic. Nose: No congestion/rhinnorhea. Mouth/Throat: Mucous membranes are moist.  Oropharynx non-erythematous. Neck: No stridor.   Hematological/Lymphatic/Immunilogical: No cervical lymphadenopathy. Cardiovascular: Normal rate, regular rhythm. Grossly normal heart sounds.  Good peripheral circulation. Respiratory: Normal respiratory effort.  No retractions. Lungs CTAB. Gastrointestinal: Soft and nontender. No distention. No abdominal bruits. No CVA tenderness. Genitourinary:  Musculoskeletal: No lower extremity tenderness nor edema.  No joint effusions. Neurologic: Cranial nerves II through XII normal as tested. Skin:  Skin is warm, dry and intact. No rash noted. Psychiatric: Mood and affect are normal. Speech and behavior are normal.  ____________________________________________   LABS (all labs ordered are listed, but only abnormal results are displayed)  Labs Reviewed  COMPREHENSIVE METABOLIC PANEL - Abnormal; Notable for the following components:      Result Value   Glucose, Bld 104 (*)    All other components within normal limits  URINALYSIS, COMPLETE (UACMP) WITH MICROSCOPIC - Abnormal; Notable for the following components:   Color, Urine YELLOW (*)    APPearance HAZY (*)    Bacteria, UA RARE (*)    All other components within normal limits  CBC  POC URINE PREG, ED   ____________________________________________  EKG  Not indicated. ____________________________________________  RADIOLOGY  ED MD interpretation:    Not indicated. I, 01/28/21, personally viewed and evaluated these images (plain radiographs) as part of my medical decision making, as well as  reviewing the written report by the radiologist.  Official radiology report(s): No results found.  ____________________________________________   PROCEDURES  Procedure(s) performed (including Critical Care):  Procedures  ____________________________________________   INITIAL IMPRESSION / ASSESSMENT AND PLAN     20 year old female presenting to the emergency department for evaluation after experiencing symptoms as described in the HPI. At this time, her speech is clear and organized. She is hyperfocused on her lower extremities and states they feel tingly. She is able to bear weight and has a coordinated and steady unassisted gait. Will review labs and urinalysis  ED COURSE  MRI from 01/07/2021 reviewed and is normal.  Lab studies from 12/24/2020 reviewed as well.  Thyroid panel was normal, B12 was normal in the remainder are unremarkable.  Labs today are also reassuring.  Urinalysis is clear.  Urine pregnancy test is negative.  She is currently taking prednisone and Adderall.  Parents gave her half doses of each of the medicines this morning, 2.5 mg each.  She has a follow-up appointment with primary care tomorrow.  At  her last visit, they had also discussed referring her to Dr. Sherryll Burger in neurology to ensure that the numbness and tingling sensation in the lower extremities is related to her mental health condition and not some other underlying causes. Plan will be to have her keep that appointment as scheduled.      As part of my medical decision making, I reviewed the following data within the electronic MEDICAL RECORD NUMBEROld chart, labs, discussed with ED attending, Dr. Terrilee Files.  ___________________________________________   FINAL CLINICAL IMPRESSION(S) / ED DIAGNOSES  Final diagnoses:  Peripheral polyneuropathy  Mixed obsessional thoughts and acts  Anxiety     ED Discharge Orders     None        Stacey Foley was evaluated in Emergency Department on 01/26/2021  for the symptoms described in the history of present illness. She was evaluated in the context of the global COVID-19 pandemic, which necessitated consideration that the patient might be at risk for infection with the SARS-CoV-2 virus that causes COVID-19. Institutional protocols and algorithms that pertain to the evaluation of patients at risk for COVID-19 are in a state of rapid change based on information released by regulatory bodies including the CDC and federal and state organizations. These policies and algorithms were followed during the patient's care in the ED.   Note:  This document was prepared using Dragon voice recognition software and may include unintentional dictation errors.    Chinita Pester, FNP 01/26/21 1734    Gilles Chiquito, MD 01/26/21 2133

## 2021-01-26 NOTE — ED Notes (Signed)
Patient discharged to home per MD order. Patient in stable condition, and deemed medically cleared by ED provider for discharge. Discharge instructions reviewed with patient/family using "Teach Back"; verbalized understanding of medication education and administration, and information about follow-up care. Denies further concerns. ° °

## 2021-01-26 NOTE — Discharge Instructions (Addendum)
Verbal instructions reviewed

## 2021-01-26 NOTE — ED Notes (Signed)
Pt awaiting results, states continues to have brief episodes of numbness to legs. States to posterior lower legs at times and then also to upper thigh. Pt denies any at this moment but states she feels like episodes are occurring more frequently. Pt able to ambulate to bathroom without difficulty.

## 2021-01-27 NOTE — Telephone Encounter (Signed)
Mom was notified records were sent last week.

## 2021-01-30 ENCOUNTER — Ambulatory Visit: Payer: 59 | Admitting: Physician Assistant

## 2021-02-11 ENCOUNTER — Telehealth: Payer: Self-pay | Admitting: Physician Assistant

## 2021-02-11 NOTE — Telephone Encounter (Signed)
She is in a partial hospitalization program right now, [facility]. There is ROI on file with them. 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 She thought you had mentioned it. Not getting better, still having cognitive decline. Her mental health is getting worse. [phone] or can call [phone] Mom

## 2021-02-12 NOTE — Telephone Encounter (Signed)
Mom said that you weaned patient off Celexa and asked you about another medication and you wrote down Anafranil and mom gave that info to the psychiatrist at the facility. Mom states she had been on it about 2 weeks and I told her to ask psychiatrist about it. The warning about supplements came with the info provided by the pharmacy. Mom said that neither she nor the patient liked the psychiatrist and there were a lot of complaints about her. Mom is not sure patient is getting any benefit from the program due to this. Mom also says it is difficult to get patient up and to the facility. She will be discharged either 12/7 or 12/14. I told her to call when they had a date to see if we could get patient on a cancellation list to be seen sooner.

## 2021-02-12 NOTE — Telephone Encounter (Signed)
I'm not sure what she is talking about the modafinil either.  Please see if she is under the care of a psychiatric provider at [facility].  If she is, she needs to direct all these questions to that person while she is under their care.

## 2021-02-12 NOTE — Telephone Encounter (Signed)
Please call patient, (this may have been her mom who called) and explained that it is going to take a while, maybe even several months for depression and cognition to get back to normal after intensive therapy and medications.  If she is having other issues, like visual changes, hearing difficulty that is new, slurred speech, weakness on one side or the other, she should go to the ER if acute or make an appointment with her PCP for further evaluation. I recommend she add a multivitamin, ginkgo biloba OTC dose, NAC 600 mg, 1 p.o. twice daily (she may have to order this on [employer], it is sometimes hard to find) B complex, fish oil.  All the supplements can be beneficial for mental health. We had discussed PANDAS, I explained to patient and her mom at that visit that there is a theory that having had a recent strep infection can possibly worsen anxiety, lead to OCD.  We discussed the fact that there is no way to prove that and no treatment change would be made.  It was just information that I wanted to share.  They can look it up on line, Web MD is a reputable site, if they want more information. Thank you

## 2021-02-12 NOTE — Telephone Encounter (Signed)
Called mom with info. She had concerns about taking all the supplements with ? Modafinil that was recommended for OCD. She said that the information sheet said not to take supplements with it. I did not see this medication in your notes and didn't know if that was something she started in her partial hospitalization program. Mom is a bit overwhelmed and is trying to research as much as she can about PANDAS. The recommended website was provided. She asked if you could recommend a particular program. Patient does have an appt 1/3.

## 2021-02-12 NOTE — Telephone Encounter (Signed)
Sounds great, thanks Hamilton.

## 2021-02-17 ENCOUNTER — Ambulatory Visit (INDEPENDENT_AMBULATORY_CARE_PROVIDER_SITE_OTHER): Payer: 59 | Admitting: Gastroenterology

## 2021-02-17 ENCOUNTER — Encounter: Payer: Self-pay | Admitting: Gastroenterology

## 2021-02-17 ENCOUNTER — Other Ambulatory Visit: Payer: Self-pay

## 2021-02-17 ENCOUNTER — Ambulatory Visit
Admission: RE | Admit: 2021-02-17 | Discharge: 2021-02-17 | Disposition: A | Discharge status: Home / Self Care | Payer: 59 | Attending: Gastroenterology | Admitting: Gastroenterology

## 2021-02-17 ENCOUNTER — Ambulatory Visit
Admission: RE | Admit: 2021-02-17 | Discharge: 2021-02-17 | Disposition: A | Discharge status: Home / Self Care | Payer: 59 | Source: Ambulatory Visit | Attending: Gastroenterology | Admitting: Gastroenterology

## 2021-02-17 VITALS — BP 107/72 | HR 101 | Temp 97.9°F | Ht 63.0 in | Wt 104.6 lb

## 2021-02-17 DIAGNOSIS — R140 Abdominal distension (gaseous): Secondary | ICD-10-CM | POA: Diagnosis present

## 2021-02-17 DIAGNOSIS — K5904 Chronic idiopathic constipation: Secondary | ICD-10-CM | POA: Diagnosis not present

## 2021-02-17 NOTE — Progress Notes (Signed)
This was went to me by mistake.

## 2021-02-17 NOTE — Progress Notes (Signed)
Cephas Darby, MD [address]  [place]  [place], [place] [address]  Main: [phone]  Fax: [phone]    Gastroenterology Consultation  Referring Provider:     Einar Pheasant, MD Primary Care Physician:  McLean-Scocuzza, Nino Glow, MD Primary Gastroenterologist:  Dr. Cephas Darby Reason for Consultation:     Abdominal bloating, irregular bowel habits        HPI:   Stacey Foley is a 20 y.o. female referred by Dr. Terese Door, Nino Glow, MD  for consultation & management of abdominal bloating, irregular bowel habits.  Patient is accompanied by her mom today and both provided patient's history.  Patient states that she had COVID in early August and was taken off birth control in late September, diagnosed with some unknown virus for her throat pain.  Since then she has been experiencing some brain fog, confusion, delayed speech and cognitive decline.  Patient states that her underlying psychological conditions have escalated because of the symptoms.  She was empirically treated with Adderall and prednisone.  Patient was able to speak however she developed side effects from it.  Patient was hyper focused at work and diagnosed with OCD.  Patient has been actively pursuing behavioral therapy for her underlying psychological conditions she is in partial hospital program for mental health issues and has been doing well lately.  Patient is also evaluated by neurology for tingling and numbness in her feet, underwent MRI of the brain which was unremarkable.  EEG is pending.  Her labs including vitamin B1, B6, ESR, folate, HbA1c, ANA, rheumatoid factor, protein electrophoresis, TSH, vitamin D, B12, CBC and CMP were all unremarkable.  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  For last 2 weeks, she has started taking MiraLAX daily and reports having soft bowel movements daily.  However, she states that she still has to strain and spends on her phone while sitting on the potty, at least for 15 minutes.  She is not experiencing abdominal bloating.  She currently stays with her mom and out of job temporarily  Patient does not smoke or drink alcohol  NSAIDs: None  Antiplts/Anticoagulants/Anti thrombotics: None  GI Procedures: None She denies family history of celiac disease,  Past Medical History:  Diagnosis Date   Abdominal pain    Anxiety    Anxiety    COVID-19    10/22/20   Nausea    OCD (obsessive compulsive disorder)     Past Surgical History:  Procedure Laterality Date   KNEE SURGERY Right    TONSILLECTOMY     WISDOM TOOTH EXTRACTION     Current Outpatient Medications:    clomiPRAMINE (ANAFRANIL) 25 MG capsule, Take 25 mg by mouth at bedtime., Disp: , Rfl:    gabapentin (NEURONTIN) 100 MG capsule, Take 100 mg by mouth 3 (three) times daily., Disp: , Rfl:    Probiotic Product (PROBIOTIC DAILY PO), Take by mouth., Disp: , Rfl:    traZODone (DESYREL) 50 MG tablet, TAKE 0.5-2 TABLETS (25-100 MG TOTAL) BY MOUTH AT BEDTIME AS NEEDED FOR SLEEP., Disp: 180 tablet, Rfl: 0   amphetamine-dextroamphetamine (ADDERALL) 10 MG tablet, dextroamphetamine-amphetamine 10 mg tablet (Patient not taking: Reported on 02/17/2021), Disp: , Rfl:    cholecalciferol (VITAMIN D3) 25 MCG (1000 UNIT) tablet, Take 1,000 Units  by mouth daily. (Patient not taking: Reported on 02/17/2021), Disp: , Rfl:    citalopram (CELEXA) 20 MG tablet, Take 1 tablet (20 mg total) by mouth daily. (Patient not taking: Reported on 02/17/2021), Disp: 30 tablet, Rfl: 1   hydrOXYzine (ATARAX/VISTARIL) 25 MG tablet, Take 0.5-1 tablets (12.5-25 mg total) by mouth daily as needed for anxiety. (Patient not taking: Reported on 02/17/2021), Disp: 90 tablet, Rfl: 3    mupirocin ointment (BACTROBAN) 2 %, Apply 1 application topically 3 (three) times daily as needed. (Patient not taking: Reported on 01/15/2021), Disp: 30 g, Rfl: 0   predniSONE (DELTASONE) 10 MG tablet, Take by mouth. (Patient not taking: Reported on 02/17/2021), Disp: , Rfl:     Family History  Problem Relation Age of Onset   Healthy Mother    Hypertension Father      Social History   Tobacco Use   Smoking status: Former    Types: E-cigarettes   Smokeless tobacco: Never  Vaping Use   Vaping Use: Every day  Substance Use Topics   Alcohol use: Never   Drug use: Never    Allergies as of 02/17/2021 - Review Complete 02/17/2021  Allergen Reaction Noted   Peanut-containing drug products  10/05/2018    Review of Systems:    All systems reviewed and negative except where noted in HPI.   Physical Exam:  BP 107/72 (BP Location: Left Arm, Patient Position: Sitting, Cuff Size: Normal)   Pulse (!) 101   Temp 97.9 F (36.6 C) (Oral)   Ht '5\' 3"'  (1.6 m)   Wt 104 lb 9.6 oz (47.4 kg)   LMP 02/17/2021   BMI 18.53 kg/m  Patient's last menstrual period was 02/17/2021.  General:   Alert,  Well-developed, well-nourished, pleasant and cooperative in NAD Head:  Normocephalic and atraumatic. Eyes:  Sclera clear, no icterus.   Conjunctiva pink. Ears:  Normal auditory acuity. Nose:  No deformity, discharge, or lesions. Mouth:  No deformity or lesions,oropharynx pink & moist. Neck:  Supple; no masses or thyromegaly. Lungs:  Respirations even and unlabored.  Clear throughout to auscultation.   No wheezes, crackles, or rhonchi. No acute distress. Heart:  Regular rate and rhythm; no murmurs, clicks, rubs, or gallops. Abdomen:  Normal bowel sounds. Soft, non-tender and non-distended without masses, hepatosplenomegaly or hernias noted.  No guarding or rebound tenderness.   Rectal: Not performed Msk:  Symmetrical without gross deformities. Good, equal movement & strength bilaterally. Pulses:   Normal pulses noted. Extremities:  No clubbing or edema.  No cyanosis. Neurologic:  Alert and oriented x3;  grossly normal neurologically. Skin:  Intact without significant lesions or rashes. No jaundice. Psych:  Alert and cooperative. Normal mood and affect.  Imaging Studies: Reviewed  Assessment and Plan:   Stacey Foley is a 20 y.o. 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  Patient has history of irregular bowel habits and abdominal bloating with decreased appetite.  Work-up unremarkable so far   Constipation, abdominal bloating and decreased appetite Discussed with patient that her GI symptoms are either likely side effect of medication or postviral slow motility or inadequate intake of fiber and water Discussed about high-fiber diet and adequate intake of water Encouraged her to take MiraLAX daily and add fiber supplements such as Benefiber or Metamucil or Citrucel or fiber choice Abdominal x-ray  today Rule out celiac disease, check H. pylori breath test Discussed about adequate calorie intake, on average 1500 cal daily, have 3 meals a day  Follow up in 3 months   Cephas Darby, MD

## 2021-02-18 ENCOUNTER — Encounter: Payer: Self-pay | Admitting: Gastroenterology

## 2021-02-19 LAB — CELIAC DISEASE PANEL
Endomysial IgA: NEGATIVE
IgA/Immunoglobulin A, Serum: 171 mg/dL (ref 87–352)
Transglutaminase IgA: <2 U/mL (ref 0–3)

## 2021-02-19 LAB — H. PYLORI BREATH TEST: H pylori Breath Test: NEGATIVE

## 2021-02-25 ENCOUNTER — Ambulatory Visit (INDEPENDENT_AMBULATORY_CARE_PROVIDER_SITE_OTHER): Payer: 59 | Admitting: Physician Assistant

## 2021-02-25 ENCOUNTER — Other Ambulatory Visit: Payer: Self-pay

## 2021-02-25 ENCOUNTER — Encounter: Payer: Self-pay | Admitting: Physician Assistant

## 2021-02-25 VITALS — Wt 106.4 lb

## 2021-02-25 DIAGNOSIS — F411 Generalized anxiety disorder: Secondary | ICD-10-CM | POA: Diagnosis not present

## 2021-02-25 DIAGNOSIS — Z8659 Personal history of other mental and behavioral disorders: Secondary | ICD-10-CM

## 2021-02-25 DIAGNOSIS — F331 Major depressive disorder, recurrent, moderate: Secondary | ICD-10-CM | POA: Diagnosis not present

## 2021-02-25 DIAGNOSIS — F422 Mixed obsessional thoughts and acts: Secondary | ICD-10-CM | POA: Diagnosis not present

## 2021-02-25 DIAGNOSIS — R5383 Other fatigue: Secondary | ICD-10-CM

## 2021-02-25 MED ORDER — GABAPENTIN 100 MG PO CAPS: 100.0000 mg | ORAL_CAPSULE | Freq: Three times a day (TID) | ORAL | 1 refills | Status: AC

## 2021-02-25 MED ORDER — TRAZODONE HCL 50 MG PO TABS: 75.0000 mg | ORAL_TABLET | Freq: Every day | ORAL | 0 refills | Status: AC

## 2021-02-25 NOTE — Progress Notes (Signed)
[facility]  Patient ID: GLENDOLA FRIEDHOFF,  MRN: [id number]  PCP: McLean-Scocuzza, Pasty Spillers, MD  Date of Evaluation: 02/25/2021 Time spent:40 minutes  Chief Complaint:  Chief Complaint   Anxiety; Depression; Insomnia; Follow-up       HISTORY/CURRENT STATUS: HPI  For f/u. Accompanied by her Dad.   Went to [facility] for 4 weeks. Said she couldn't concentrate even though she was trying very hard to learn what they were teaching her, so states she was released from the program, at least for that time.  Was given Clomipramine by psych at [facility] but Dad states it made her more OCD, and her PCP took her off it last week and put her on Ativan instead. that's made her sleepy, so her parents cut back the dose on that after a few days, parents thought it was making her depression worse so they stopped a few days ago.  Pt is concerned about having Bipolar D/O or Borderline Personality d/o, Lyme Disease, and is obsessed about PANDAS  (that I mentioned just out of curiosity of strep then OCD.)   Patient states she is having a hard time being so physically ill and no one being able to diagnose and treat her.  Then she gets depressed because she does not feel like there is any hope.  She reports feelings of electrical impulses or "hot burning" places on her skin that comes and goes and is not always in the same location.  "It is hard to explain.  I just feel so bad."  She is not sure if it is the physical problems or the mental problems, or both, but she is having a hard time enjoying things.  Energy and motivation are low.  She was having trouble sleeping and trazodone was sent in last week by me, her dad states that they contacted her PCP at the same time who sent in Ativan.  She was prescribed Xanax the month before but she and her dad both states she never took that although it was picked up from the pharmacy.  The trazodone has helped her sleep except for last night.  She was given  gabapentin by her PCP a week ago but it does not sound like she has been taking it routinely as directed.  Her dad states she sleeps late and cannot always get in the 3 pills/day.  She still obsesses about things, has a hard time letting stuff go.  It is not any specific topic.  No compulsions.  She does cry easily at times.  States she had suicidal thoughts yesterday but describes it as tired of being sick so therefore she does not want to live.  She denies having a plan for suicide.  Her dad corroborates.  She is very open with her parents about her feelings.   Review of Systems  Constitutional:  Positive for malaise/fatigue.  HENT: Negative.    Eyes: Negative.   Respiratory: Negative.    Cardiovascular: Negative.   Gastrointestinal: Negative.   Genitourinary: Negative.   Musculoskeletal:  Positive for joint pain and myalgias.       Complains of multi-joint pain, bilat knees, left shoulder  Skin: Negative.   Neurological:  Positive for headaches.       Chronic H/A. Has dizziness when she stands up. No acute neuro deficits. Sees neurology.    Endo/Heme/Allergies: Negative.   Psychiatric/Behavioral:         See HPI.    Individual Medical History/ Review of Systems:  Changes? :Yes      Past medications for mental health diagnoses include: Melatonin, questionable ADHD meds  Allergies: Peanut-containing drug products  Current Medications:  Current Outpatient Medications:    Probiotic Product (PROBIOTIC DAILY PO), Take by mouth., Disp: , Rfl:    traZODone (DESYREL) 50 MG tablet, TAKE 0.5-2 TABLETS (25-100 MG TOTAL) BY MOUTH AT BEDTIME AS NEEDED FOR SLEEP., Disp: 180 tablet, Rfl: 0   gabapentin (NEURONTIN) 100 MG capsule, Take 1 capsule (100 mg total) by mouth 3 (three) times daily., Disp: 90 capsule, Rfl: 1 Medication Side Effects: none  Family Medical/ Social History: Changes? Works at [employer] and [employer]. Lives at home with parents.  Has been out of work for several months  now due to physical and mental symptoms.  MENTAL HEALTH EXAM:  Weight 106 lb 6.4 oz (48.3 kg), last menstrual period 02/17/2021.Body mass index is 18.85 kg/m.  General Appearance: Casual, Guarded, Well Groomed, and looks very tired.  She lies down on the couch immediately upon entering the office.  Eye Contact:  Fair  Speech:  Clear and Coherent  Volume:  Normal  Mood:  Anxious  Affect:  Constricted and Anxious  Thought Process:  Goal Directed and Descriptions of Associations: Circumstantial  Orientation:  Full (Time, Place, and Person)  Thought Content: Logical   Suicidal Thoughts:  Yes.  without intent/plan  Homicidal Thoughts:  No  Memory:  WNL  Judgement:  Good  Insight:  Good  Psychomotor Activity:  Normal  Concentration:  Concentration: Fair and Attention Span: Fair  Recall:  Good  Fund of Knowledge: Good  Language: Good  Assets:  Desire for Improvement  ADL's:  Intact  Cognition: WNL  Prognosis:  Good   Reviewed normal labs from 01/26/2021, 01/30/2021, 02/17/2021.  See all on chart.   DIAGNOSES:    ICD-10-CM   1. Major depressive disorder, recurrent episode, moderate (HCC)  F33.1     2. Generalized anxiety disorder  F41.1     3. History of suicidal ideation  Z86.59     4. Mixed obsessional thoughts and acts  F42.2     5. Fatigue, unspecified type  R53.83          Receiving Psychotherapy: Yes    RECOMMENDATIONS:  PDMP reviewed.  Ativan filled 02/17/2021.  Xanax filled 01/19/2021.  Adderall filled 01/14/2021.  All prescribed by Bethann Punches, MD. I provided 40 minutes of face to face time during this encounter, including time spent before and after the visit in records review, medical decision making, counseling pertinent to today's visit, and charting.  We discussed different options for treatment.  Rather than starting another medication, I think the gabapentin is a great idea as it can help with neuropathy symptoms as well as the anxiety.  So I do recommend  she take that as directed by her PCP. I also recommend taking the trazodone routinely, not just as needed.  This can help with the depression as well. Contract for safety is in place, call our office, [phone], or go to [facility] if suicidal thoughts recur. We briefly discussed lithium as an option because it can help with depression even 9 bipolar depression, and treat suicidal thoughts.  Again I prefer not to add more medication, have her take the one she is on as directed to see if they will be beneficial or not. I discussed different diagnoses of bipolar disorder and how it is difficult to diagnose, in some cases there has not  been enough time passed to see a pattern in behaviors.  I do not know [place] well yet but I do not feel like that is her diagnosis.  We also discussed PANDAS and I told her not to obsess over it.  If she did have strep or another bacterial or viral illness preceding the extreme OCD symptoms, it does not make one difference in her treatment.  I explained to her that I only mention that because it is a medical curiosity to me and I wanted to share it. Patient's mom wants to have more frequent appointments with me, once a week.  I stated that any medication changes I make will take 4 to 6 weeks to take effect, at least most of the time, and a weekly appointment is not warranted.  Also, I am not a therapist so weekly visits from a medication standpoint are not appropriate. Her dad asked about seeing the psychiatrist in our practice, unfortunately he is not accepting new patients.  I gave him the names of Dr. Danelle Berry and Dr. Jannifer Franklin, recommending them because I think she would benefit most from a child psychiatrist if they decide to seek another medical provider.  I am happy to continue seeing her if the patient and her parents want me to and if needed I will consult with my supervising physician Dr. Meredith Staggers. Patient has an appointment sometime  within the next month at [facility], which I think is a great idea.  She also has an upcoming appointment with neurology for nerve conduction studies. Compliance with the following medications were stressed. Continue gabapentin 100 mg, 1 p.o. 3 times daily. Continue trazodone 50 mg, 1.5 pills nightly (not as needed.) Continue counseling. Return in 4 to 6 weeks.  Melony Overly, PA-C

## 2021-02-26 ENCOUNTER — Ambulatory Visit: Payer: 59 | Admitting: Physician Assistant

## 2021-02-26 ENCOUNTER — Encounter: Payer: Self-pay | Admitting: Gastroenterology

## 2021-02-27 ENCOUNTER — Other Ambulatory Visit: Payer: Self-pay | Admitting: Gastroenterology

## 2021-02-27 DIAGNOSIS — K5904 Chronic idiopathic constipation: Secondary | ICD-10-CM

## 2021-02-27 MED ORDER — LINACLOTIDE 145 MCG PO CAPS
145.0000 ug | ORAL_CAPSULE | Freq: Every day | ORAL | 0 refills | Status: DC
Start: 2021-02-27 — End: 2021-05-29

## 2021-03-17 ENCOUNTER — Ambulatory Visit: Payer: 59 | Admitting: Physician Assistant

## 2021-03-23 ENCOUNTER — Ambulatory Visit: Payer: 59 | Admitting: Neurology

## 2021-04-08 ENCOUNTER — Ambulatory Visit: Payer: 59 | Admitting: Physician Assistant

## 2021-04-30 ENCOUNTER — Other Ambulatory Visit: Payer: Self-pay | Admitting: Physician Assistant

## 2021-05-01 NOTE — Telephone Encounter (Signed)
Patient has cancelled all appts she had scheduled with [facility]. Called to inquire if she was going to continue to follow in our office.

## 2021-05-01 NOTE — Telephone Encounter (Signed)
LVM to [facility]. All scheduled appts have been cancelled. ? If following elsewhere.

## 2021-05-18 ENCOUNTER — Ambulatory Visit: Payer: Self-pay | Admitting: Gastroenterology

## 2021-05-29 ENCOUNTER — Other Ambulatory Visit: Payer: Self-pay | Admitting: Gastroenterology

## 2021-05-29 DIAGNOSIS — K5904 Chronic idiopathic constipation: Secondary | ICD-10-CM

## 2022-04-30 IMAGING — MR MR HEAD WO/W CM
14 series · 48 of 48 positions shown · IV contrast (gadavist)
Comparison: None.

CLINICAL DATA: Mental status change, unknown cause

EXAM:
MRI HEAD WITHOUT AND WITH CONTRAST
TECHNIQUE: Multiplanar, multiecho pulse sequences of the brain and surrounding
structures were obtained without and with intravenous contrast.
CONTRAST:  5mL GADAVIST GADOBUTROL 1 MMOL/ML IV SOLN

[Series 5: ax dwi_tracew · axial · 3.0mm · 0.65mm/px · z∈[-55,+100]mm · 4 of 48 slices shown]
[im 1/48]
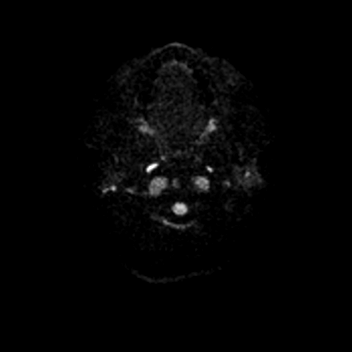
[im 16/48]
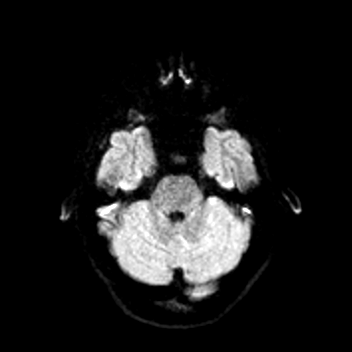
[im 32/48]
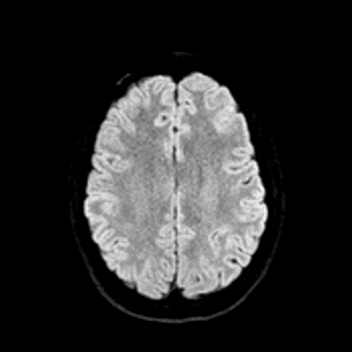
[im 48/48]
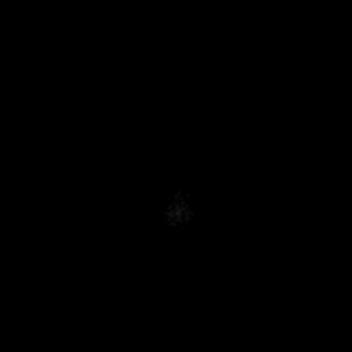

[Series 6: ax dwi_adc · axial · 3.0mm · 0.65mm/px · z∈[-55,+96]mm · 3 of 47 slices shown]
[im 1/47]
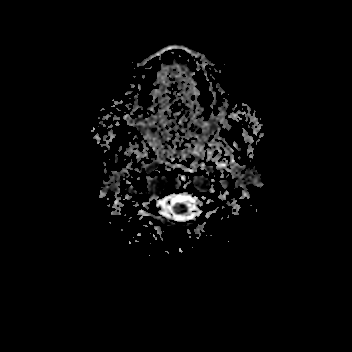
[im 24/47]
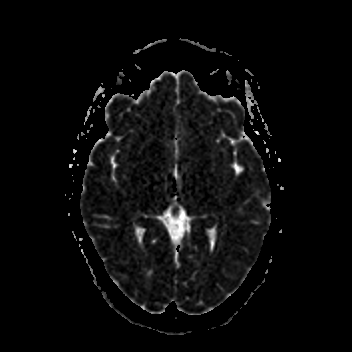
[im 47/47]
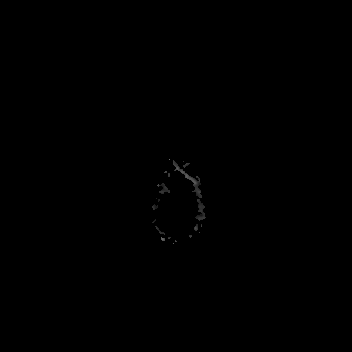

[Series 7: cor dwi_tracew · coronal · 5.0mm · 0.60mm/px · 2 of 38 slices shown]
[im 1/38]
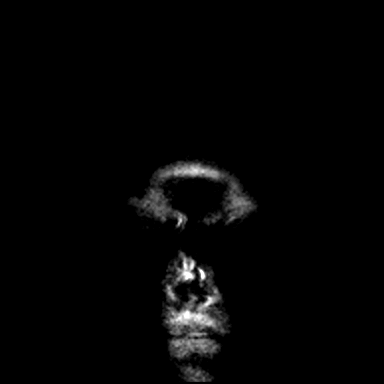
[im 38/38]
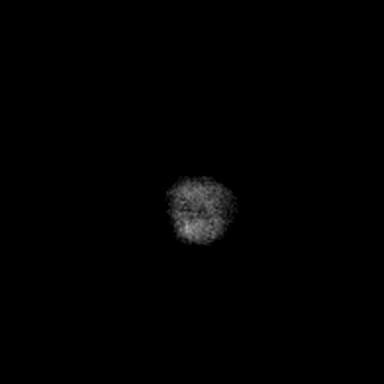

[Series 8: cor dwi_adc · coronal · 5.0mm · 0.60mm/px · 2 of 38 slices shown]
[im 1/38]
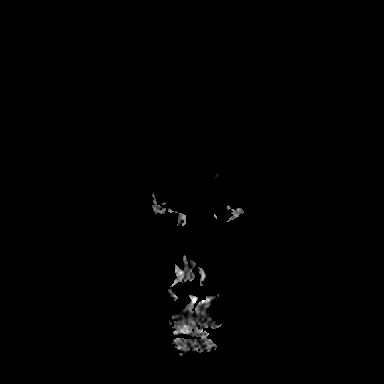
[im 38/38]
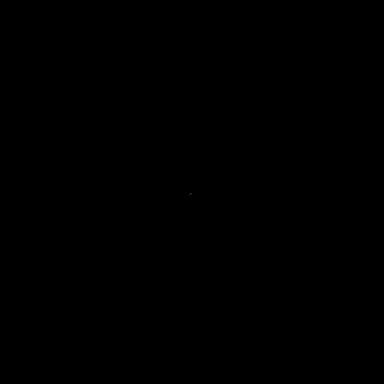

[Series 9: T1 · sagittal · 5.0mm · 0.62mm/px · 1 of 21 slices shown (1 of 2)]
[im 1/21]
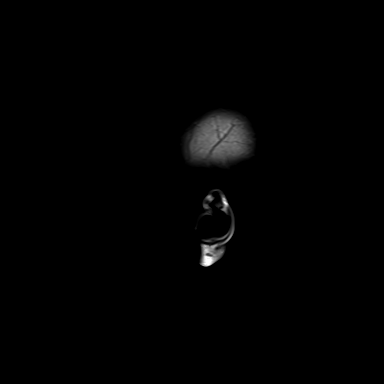

[Series 10: T2 · axial · 5.0mm · 0.53mm/px · z∈[-48,+95]mm · 2 of 25 slices shown]
[im 1/25]
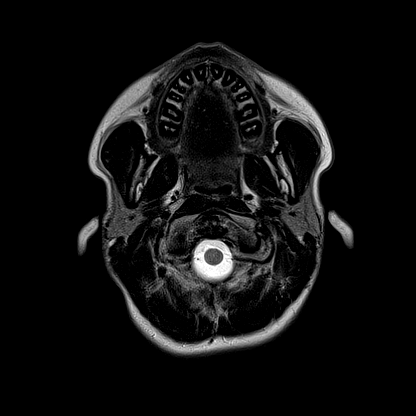
[im 25/25]
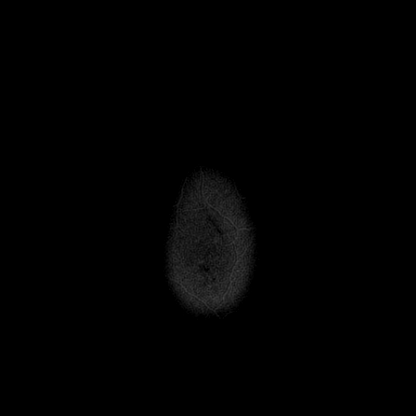

[Series 12: pha_images · axial · 3.0mm · 0.90mm/px · z∈[-53,+99]mm · 3 of 52 slices shown]
[im 1/52]
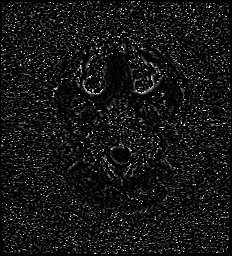
[im 26/52]
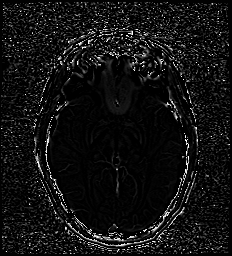
[im 52/52]
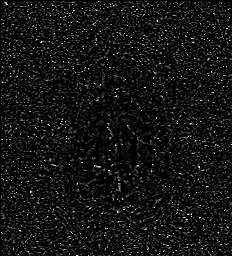

[Series 13: swi_images · axial · 3.0mm · 0.90mm/px · z∈[-53,+99]mm · 3 of 52 slices shown]
[im 1/52]
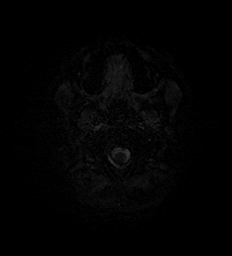
[im 26/52]
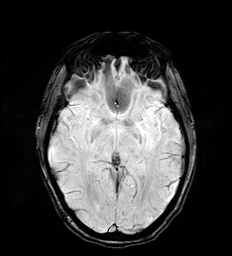
[im 52/52]
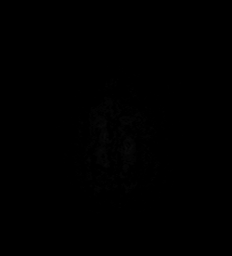

[Series 15: FLAIR · axial · 3.0mm · 0.53mm/px · z∈[-57,+104]mm · 3 of 55 slices shown]
[im 1/55]
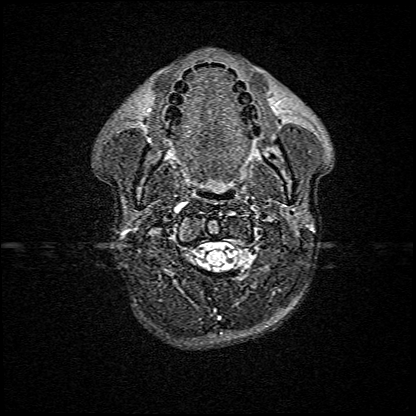
[im 28/55]
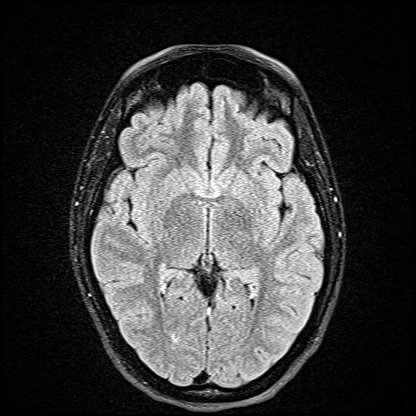
[im 55/55]
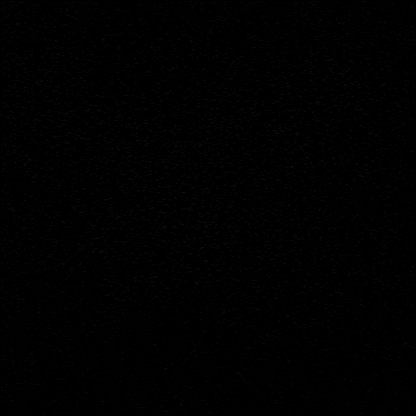

[Series 16: T1 · axial · 1.0mm · 0.98mm/px · z∈[-59,+116]mm · 10 of 172 slices shown (2 of 2)]
[im 1/172]
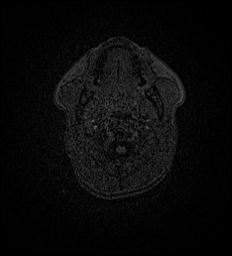
[im 20/172]
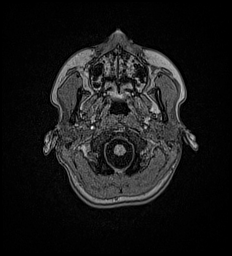
[im 39/172]
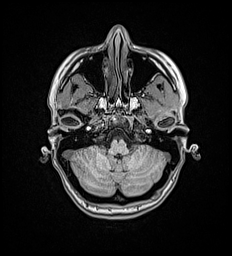
[im 58/172]
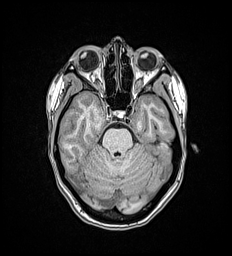
[im 77/172]
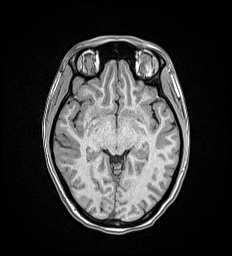
[im 96/172]
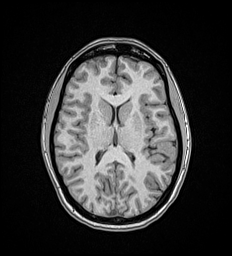
[im 115/172]
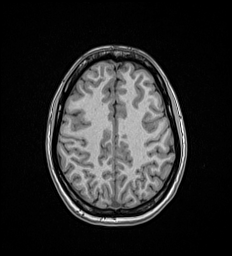
[im 134/172]
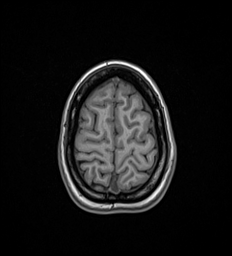
[im 153/172]
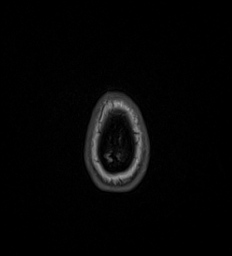
[im 172/172]
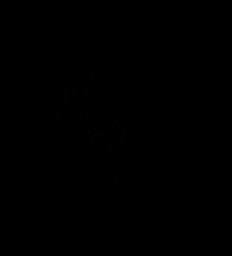

[Series 17: T2 post-contrast · coronal · 5.0mm · 0.57mm/px · 2 of 29 slices shown]
[im 1/29]
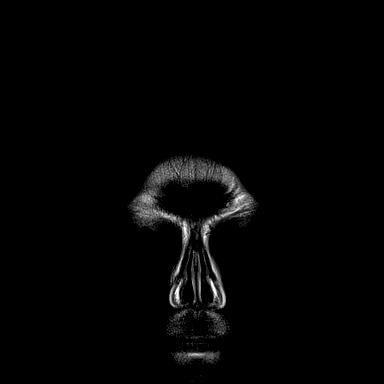
[im 29/29]
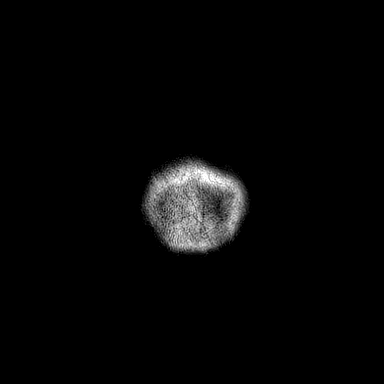

[Series 18: T1 post-contrast · axial · 1.0mm · 0.98mm/px · z∈[-59,+114]mm · 10 of 172 slices shown (1 of 3)]
[im 1/172]
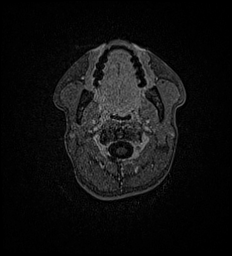
[im 20/172]
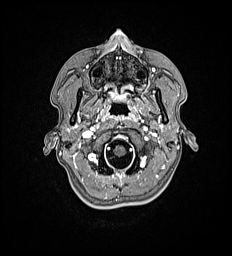
[im 39/172]
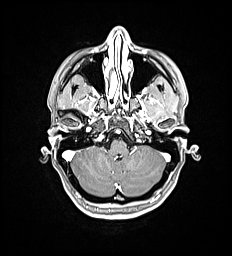
[im 58/172]
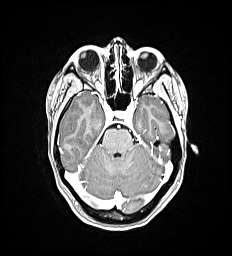
[im 77/172]
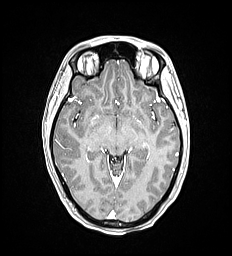
[im 96/172]
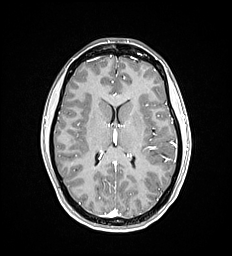
[im 115/172]
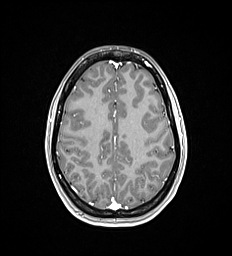
[im 134/172]
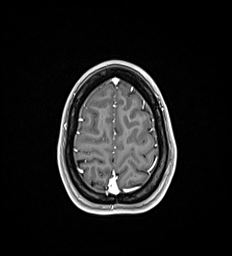
[im 153/172]
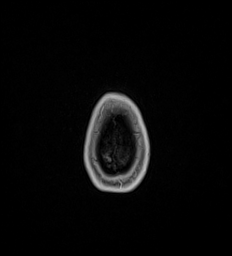
[im 172/172]
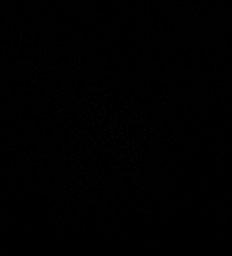

[Series 19: T1 post-contrast · coronal · 5.0mm · 0.57mm/px · 2 of 29 slices shown (2 of 3)]
[im 1/29]
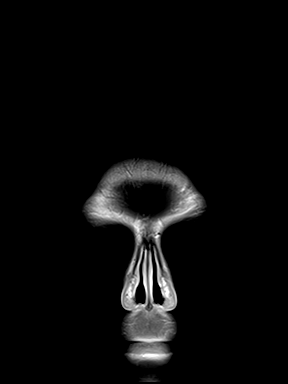
[im 29/29]
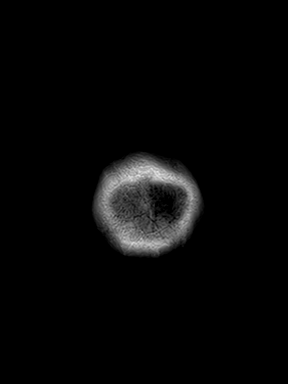

[Series 20: T1 post-contrast · sagittal · 5.0mm · 0.62mm/px · 1 of 21 slices shown (3 of 3)]
[im 1/21]
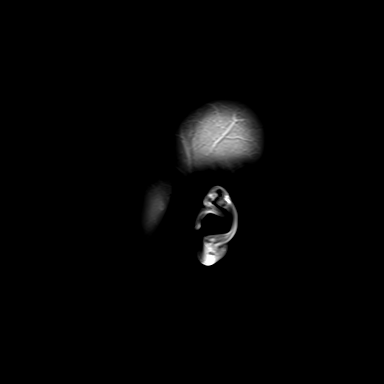

[48 of 48 positions shown; findings below may reference images not displayed]

FINDINGS: Brain: There is no acute infarction or intracranial hemorrhage.
There is no intracranial mass, mass effect, or edema. There is no
hydrocephalus or extra-axial fluid collection. Ventricles and sulci
are normal in size and configuration. No abnormal enhancement.

Vascular: Major vessel flow voids at the skull base are preserved.

Skull and upper cervical spine: Marrow signal is within normal
limits.

Sinuses/Orbits: Paranasal sinuses are aerated. Orbits are
unremarkable.

Other: Sella is unremarkable.  Mastoid air cells are clear.
IMPRESSION: Normal MRI of the brain.

## 2022-06-10 IMAGING — CR DG ABDOMEN 1V
1 series · 2 of 2 positions shown · non-contrast
Comparison: 09/23/2017

CLINICAL DATA: Abdominal pain and bloating for 1 month

EXAM:
ABDOMEN - 1 VIEW

[Series 1: dg abd 1 view · 0.14mm/px · 2 of 2 slices shown]
[im 1/2]
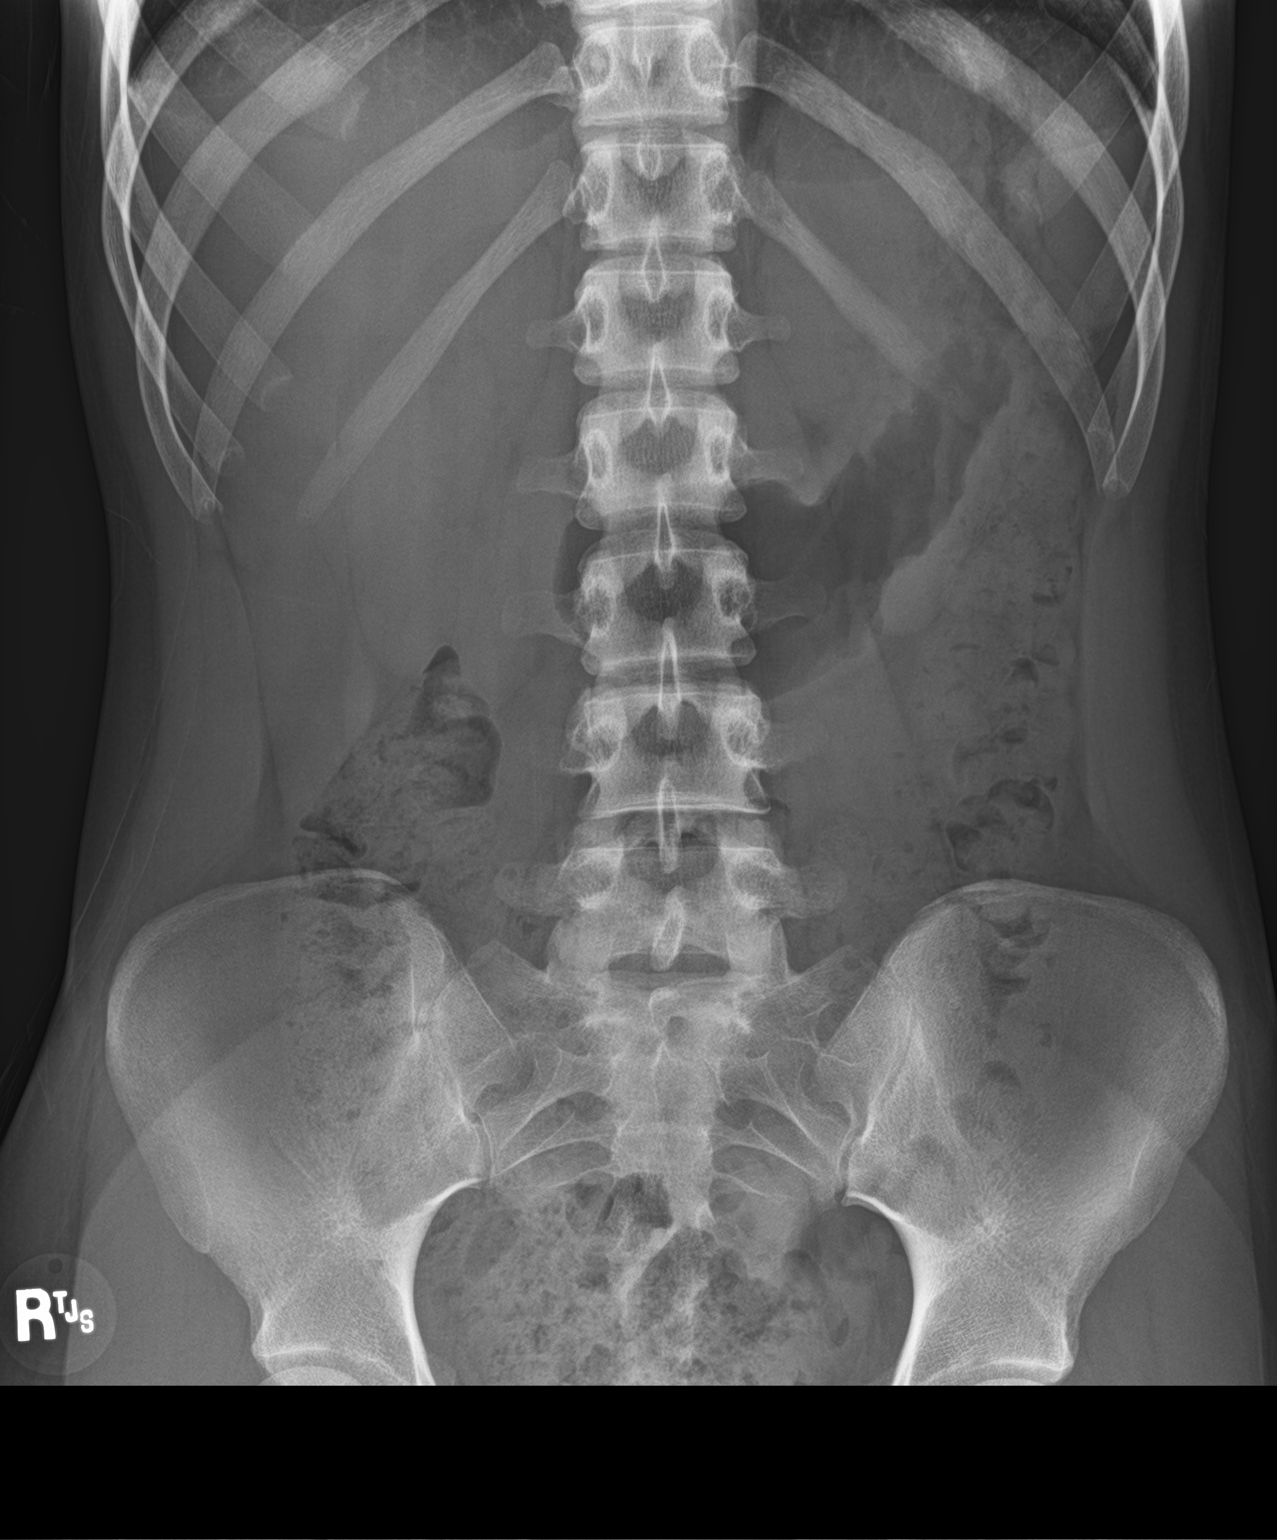
[im 2/2]
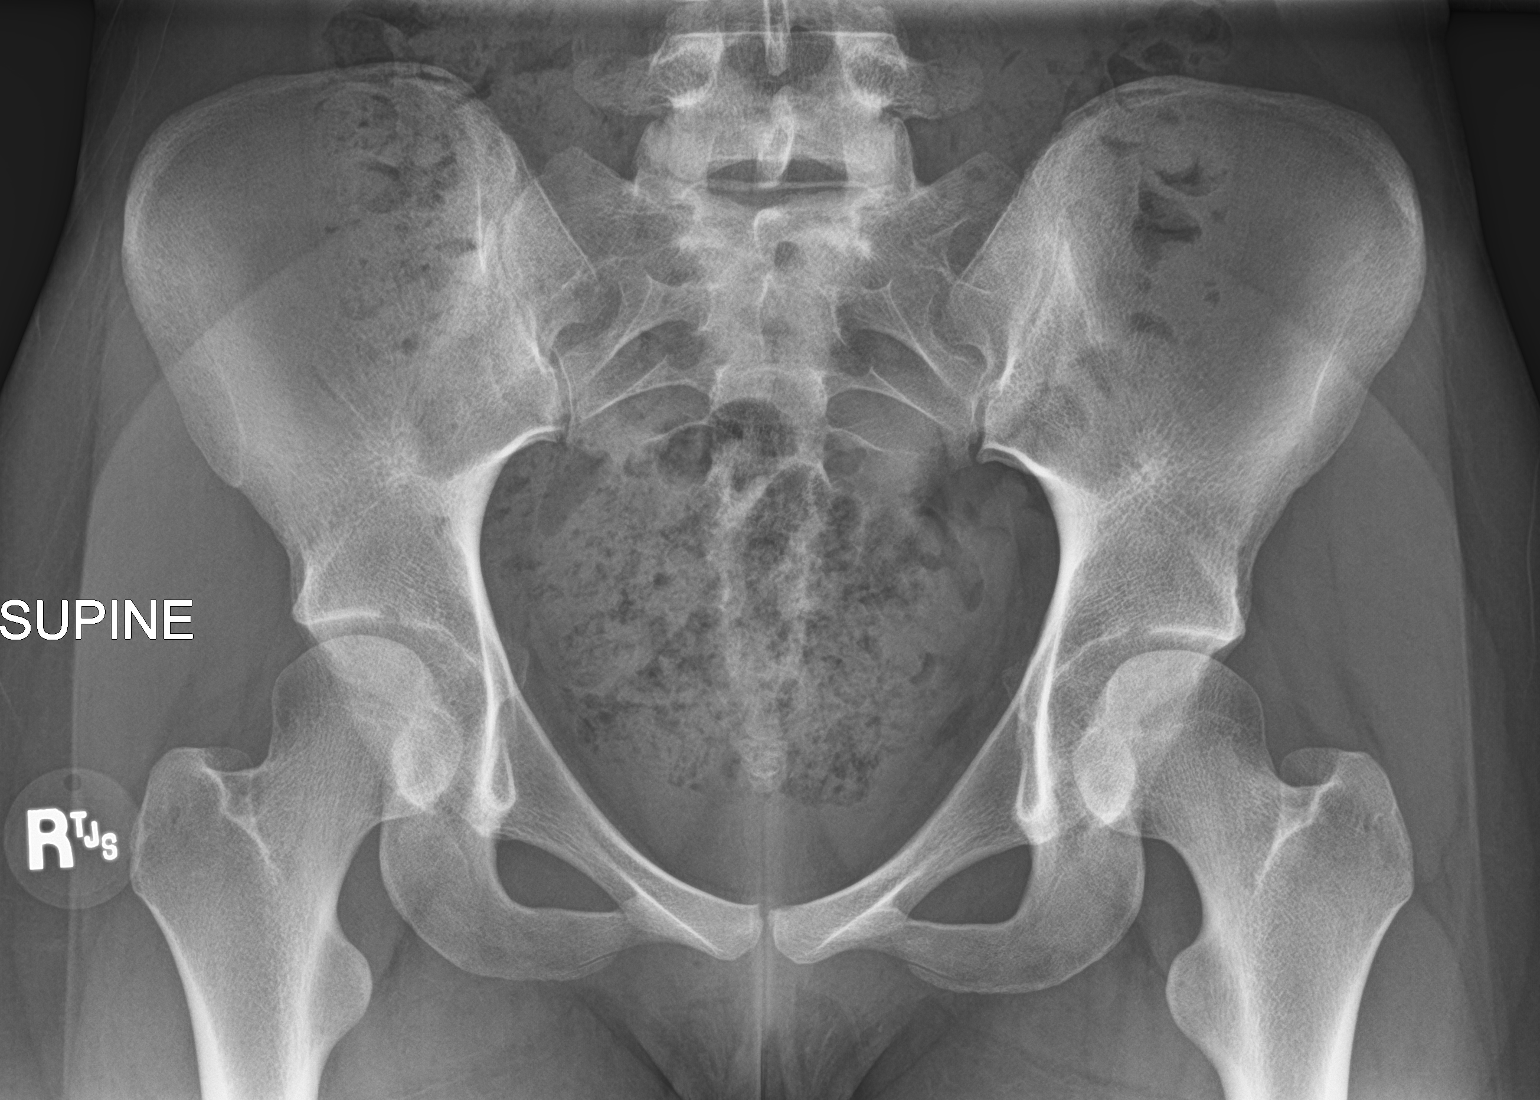

[2 of 2 positions shown; findings below may reference images not displayed]

FINDINGS: Scattered large and small bowel gas is noted. Retained fecal
material is noted throughout the colon particularly in the rectal
vault consistent with a degree of constipation. No free air is seen.
No obstructive changes are noted. No bony abnormality is noted.
IMPRESSION: Changes consistent with constipation.

## 2023-03-31 ENCOUNTER — Ambulatory Visit: Payer: Self-pay | Admitting: Dermatology
